# Patient Record
Sex: Male | Born: 1950 | Race: Black or African American | Hispanic: No | Smoking: Never smoker
Health system: Southern US, Community
[De-identification: ages and names within clinical notes are randomized; demographics above are authoritative.]

## PROBLEM LIST (undated history)

## (undated) DIAGNOSIS — I10 Essential (primary) hypertension: Secondary | ICD-10-CM

## (undated) DIAGNOSIS — K219 Gastro-esophageal reflux disease without esophagitis: Secondary | ICD-10-CM

## (undated) DIAGNOSIS — E119 Type 2 diabetes mellitus without complications: Secondary | ICD-10-CM

## (undated) DIAGNOSIS — M109 Gout, unspecified: Secondary | ICD-10-CM

## (undated) DIAGNOSIS — N189 Chronic kidney disease, unspecified: Secondary | ICD-10-CM

## (undated) DIAGNOSIS — I2510 Atherosclerotic heart disease of native coronary artery without angina pectoris: Secondary | ICD-10-CM

## (undated) DIAGNOSIS — E785 Hyperlipidemia, unspecified: Secondary | ICD-10-CM

## (undated) DIAGNOSIS — E039 Hypothyroidism, unspecified: Secondary | ICD-10-CM

## (undated) HISTORY — PX: BACK SURGERY: SHX140

## (undated) HISTORY — DX: Atherosclerotic heart disease of native coronary artery without angina pectoris: I25.10

## (undated) HISTORY — DX: Hyperlipidemia, unspecified: E78.5

## (undated) HISTORY — PX: CARDIAC CATHETERIZATION: SHX172

---

## 2016-12-16 DIAGNOSIS — M1990 Unspecified osteoarthritis, unspecified site: Secondary | ICD-10-CM | POA: Insufficient documentation

## 2016-12-16 DIAGNOSIS — I10 Essential (primary) hypertension: Secondary | ICD-10-CM | POA: Insufficient documentation

## 2016-12-16 DIAGNOSIS — E119 Type 2 diabetes mellitus without complications: Secondary | ICD-10-CM

## 2018-08-16 ENCOUNTER — Inpatient Hospital Stay (HOSPITAL_COMMUNITY)
Admission: AD | Admit: 2018-08-16 | Discharge: 2018-08-23 | DRG: 236 | Disposition: A | Discharge status: Home / Self Care | Payer: Medicare PPO | Source: Other Acute Inpatient Hospital | Attending: Thoracic Surgery (Cardiothoracic Vascular Surgery) | Admitting: Thoracic Surgery (Cardiothoracic Vascular Surgery)

## 2018-08-16 ENCOUNTER — Encounter (HOSPITAL_COMMUNITY): Payer: Self-pay | Admitting: Thoracic Surgery (Cardiothoracic Vascular Surgery)

## 2018-08-16 DIAGNOSIS — I129 Hypertensive chronic kidney disease with stage 1 through stage 4 chronic kidney disease, or unspecified chronic kidney disease: Secondary | ICD-10-CM | POA: Diagnosis present

## 2018-08-16 DIAGNOSIS — Z20828 Contact with and (suspected) exposure to other viral communicable diseases: Secondary | ICD-10-CM | POA: Diagnosis present

## 2018-08-16 DIAGNOSIS — Z951 Presence of aortocoronary bypass graft: Secondary | ICD-10-CM

## 2018-08-16 DIAGNOSIS — N183 Chronic kidney disease, stage 3 unspecified: Secondary | ICD-10-CM

## 2018-08-16 DIAGNOSIS — M109 Gout, unspecified: Secondary | ICD-10-CM | POA: Diagnosis present

## 2018-08-16 DIAGNOSIS — E039 Hypothyroidism, unspecified: Secondary | ICD-10-CM

## 2018-08-16 DIAGNOSIS — I252 Old myocardial infarction: Secondary | ICD-10-CM | POA: Diagnosis not present

## 2018-08-16 DIAGNOSIS — R079 Chest pain, unspecified: Secondary | ICD-10-CM

## 2018-08-16 DIAGNOSIS — I2510 Atherosclerotic heart disease of native coronary artery without angina pectoris: Secondary | ICD-10-CM | POA: Diagnosis present

## 2018-08-16 DIAGNOSIS — Z9889 Other specified postprocedural states: Secondary | ICD-10-CM

## 2018-08-16 DIAGNOSIS — I160 Hypertensive urgency: Secondary | ICD-10-CM | POA: Diagnosis present

## 2018-08-16 DIAGNOSIS — E1122 Type 2 diabetes mellitus with diabetic chronic kidney disease: Secondary | ICD-10-CM | POA: Diagnosis present

## 2018-08-16 DIAGNOSIS — K219 Gastro-esophageal reflux disease without esophagitis: Secondary | ICD-10-CM | POA: Diagnosis present

## 2018-08-16 DIAGNOSIS — I214 Non-ST elevation (NSTEMI) myocardial infarction: Principal | ICD-10-CM | POA: Diagnosis present

## 2018-08-16 DIAGNOSIS — R001 Bradycardia, unspecified: Secondary | ICD-10-CM | POA: Diagnosis present

## 2018-08-16 DIAGNOSIS — Z01810 Encounter for preprocedural cardiovascular examination: Secondary | ICD-10-CM | POA: Diagnosis not present

## 2018-08-16 DIAGNOSIS — I25110 Atherosclerotic heart disease of native coronary artery with unstable angina pectoris: Secondary | ICD-10-CM

## 2018-08-16 DIAGNOSIS — J90 Pleural effusion, not elsewhere classified: Secondary | ICD-10-CM

## 2018-08-16 DIAGNOSIS — E119 Type 2 diabetes mellitus without complications: Secondary | ICD-10-CM

## 2018-08-16 DIAGNOSIS — Z833 Family history of diabetes mellitus: Secondary | ICD-10-CM

## 2018-08-16 DIAGNOSIS — I440 Atrioventricular block, first degree: Secondary | ICD-10-CM

## 2018-08-16 DIAGNOSIS — Z01811 Encounter for preprocedural respiratory examination: Secondary | ICD-10-CM

## 2018-08-16 HISTORY — DX: Chronic kidney disease, unspecified: N18.9

## 2018-08-16 HISTORY — DX: Gastro-esophageal reflux disease without esophagitis: K21.9

## 2018-08-16 HISTORY — DX: Essential (primary) hypertension: I10

## 2018-08-16 HISTORY — DX: Hypothyroidism, unspecified: E03.9

## 2018-08-16 HISTORY — DX: Gout, unspecified: M10.9

## 2018-08-16 HISTORY — DX: Type 2 diabetes mellitus without complications: E11.9

## 2018-08-16 MED ORDER — HYDRALAZINE HCL 20 MG/ML IJ SOLN
10.0000 mg | Freq: Four times a day (QID) | INTRAMUSCULAR | Status: DC | PRN
  Administered 2018-08-17 – 2018-08-18 (×2): 10 mg via INTRAVENOUS
  Filled 2018-08-16 (×2): qty 1

## 2018-08-16 MED ORDER — INSULIN ASPART 100 UNIT/ML ~~LOC~~ SOLN
0.0000 [IU] | SUBCUTANEOUS | Status: DC
  Administered 2018-08-17 (×2): 4 [IU] via SUBCUTANEOUS
  Administered 2018-08-17: 8 [IU] via SUBCUTANEOUS
  Administered 2018-08-17 (×2): 4 [IU] via SUBCUTANEOUS
  Administered 2018-08-17: 12 [IU] via SUBCUTANEOUS
  Administered 2018-08-18: 4 [IU] via SUBCUTANEOUS

## 2018-08-16 MED ORDER — ASPIRIN EC 81 MG PO TBEC
81.0000 mg | DELAYED_RELEASE_TABLET | Freq: Every day | ORAL | Status: DC
  Administered 2018-08-17: 10:00:00 81 mg via ORAL
  Filled 2018-08-16: qty 1

## 2018-08-16 MED ORDER — ONDANSETRON HCL 4 MG/2ML IJ SOLN: 4.0000 mg | Freq: Four times a day (QID) | INTRAMUSCULAR | Status: DC | PRN

## 2018-08-16 MED ORDER — METOPROLOL TARTRATE 12.5 MG HALF TABLET
12.5000 mg | ORAL_TABLET | Freq: Two times a day (BID) | ORAL | Status: DC
  Administered 2018-08-17 (×3): 12.5 mg via ORAL
  Filled 2018-08-16 (×3): qty 1

## 2018-08-16 MED ORDER — NITROGLYCERIN 2 % TD OINT
1.0000 [in_us] | TOPICAL_OINTMENT | Freq: Four times a day (QID) | TRANSDERMAL | Status: DC | PRN
  Filled 2018-08-16: qty 30

## 2018-08-16 MED ORDER — ASPIRIN 81 MG PO CHEW
324.0000 mg | CHEWABLE_TABLET | ORAL | Status: AC
  Administered 2018-08-17: 01:00:00 324 mg via ORAL
  Filled 2018-08-16: qty 4

## 2018-08-16 MED ORDER — NITROGLYCERIN 0.4 MG SL SUBL: 0.4000 mg | SUBLINGUAL_TABLET | SUBLINGUAL | Status: DC | PRN

## 2018-08-16 MED ORDER — ASPIRIN 300 MG RE SUPP: 300.0000 mg | RECTAL | Status: AC

## 2018-08-16 MED ORDER — ATORVASTATIN CALCIUM 40 MG PO TABS
40.0000 mg | ORAL_TABLET | Freq: Every day | ORAL | Status: DC
  Administered 2018-08-17 – 2018-08-22 (×5): 40 mg via ORAL
  Filled 2018-08-16 (×5): qty 1

## 2018-08-16 MED ORDER — ACETAMINOPHEN 325 MG PO TABS: 650.0000 mg | ORAL_TABLET | ORAL | Status: DC | PRN

## 2018-08-16 MED ORDER — ENOXAPARIN SODIUM 40 MG/0.4ML ~~LOC~~ SOLN
40.0000 mg | SUBCUTANEOUS | Status: DC
  Administered 2018-08-17 (×2): 40 mg via SUBCUTANEOUS
  Filled 2018-08-16 (×2): qty 0.4

## 2018-08-16 NOTE — Progress Notes (Signed)
Patient arrived to [facility] with EMS, A/O X4, in NAD. Patient denied any CP, SOB. VS stable. Attending MD paged.

## 2018-08-16 NOTE — H&P (Addendum)
[address]       [place] [id number]             [phone]        Frederich ChickFrank Bordeaux [place] Medical Record #[id number] Date of Birth: 10/13/1950  Referring: No ref. provider found Primary Care: Chana BodeFatade, Ayokunle, DO Primary Cardiologist:No primary care provider on file.  Chief Complaint:   No chief complaint on file.   History of Present Illness:     68 yo male transferred [facility] with CAD, CKD, and symptomatic bradycardia.  He originally presented with nausea and vomiting, and was noted to be in HTN urgency, and bradycardic.  Cardiac evaluation revealed the CAD.  [facility] was [facility].   Past Medical and Surgical History: Previous Chest Surgery: no Previous Chest Radiation: no Diabetes Mellitus: yes.  HbA1C pending Creatinine: 1.7  Past Medical History:  Diagnosis Date  . CKD (chronic kidney disease)   . DM (diabetes mellitus) (HCC)   . GERD (gastroesophageal reflux disease)   . Gout   . HTN (hypertension)   . Hypothyroidism     Past Surgical History:  Procedure Laterality Date  . BACK SURGERY        Social History   Tobacco Use  Smoking Status Never Smoker    Social History   Substance and Sexual Activity  Alcohol Use Not Currently     Not on File  Medications: Asprin: yes Statin: yes Beta Blocker: yes Ace Inhibitor: no Anti-Coagulation: no  Current Facility-Administered Medications  Medication Dose Route Frequency Provider Last Rate Last Dose  . acetaminophen (TYLENOL) tablet 650 mg  650 mg Oral Q4H PRN Darvis Croft O, MD      . aspirin chewable tablet 324 mg  324 mg Oral NOW Drea Jurewicz O, MD       Or  . aspirin suppository 300 mg  300 mg Rectal NOW Corliss SkainsLightfoot, Elise Knobloch O, MD      . Melene Muller[START ON 08/17/2018] aspirin EC tablet 81 mg  81 mg Oral Daily Corliss SkainsLightfoot, Gurinder Toral O, MD      . Melene Muller[START ON 08/17/2018] atorvastatin (LIPITOR) tablet 40 mg  40 mg Oral q1800 Rik Wadel,  Lesley Galentine O, MD      . enoxaparin (LOVENOX) injection 40 mg  40 mg Subcutaneous Q24H Avalene Sealy O, MD      . hydrALAZINE (APRESOLINE) injection 10 mg  10 mg Intravenous Q6H PRN Corliss SkainsLightfoot, Sosie Gato O, MD      . Melene Muller[START ON 08/17/2018] insulin aspart (novoLOG) injection 0-24 Units  0-24 Units Subcutaneous Q4H Lorita Forinash O, MD      . metoprolol tartrate (LOPRESSOR) tablet 12.5 mg  12.5 mg Oral BID Ferrell Flam O, MD      . nitroGLYCERIN (NITROGLYN) 2 % ointment 1 inch  1 inch Topical Q6H PRN Rayola Everhart O, MD      . nitroGLYCERIN (NITROSTAT) SL tablet 0.4 mg  0.4 mg Sublingual Q5 Min x 3 PRN Wynell Halberg O, MD      . ondansetron (ZOFRAN) injection 4 mg  4 mg Intravenous Q6H PRN Teletha Petrea, Eliezer LoftsHarrell O, MD        No medications prior to admission.    Family History  Problem Relation Age of Onset  . Aneurysm Mother   . Diabetes Sister   . Pancreatic cancer Brother   . Diabetes Brother      Review of Systems:   Review of Systems  Constitutional: Negative.  HENT: Negative.   Respiratory: Negative.   Cardiovascular: Negative.   Gastrointestinal: Positive for nausea and vomiting.  Musculoskeletal: Positive for joint pain.  Neurological: Negative.       Physical Exam: BP (!) 155/88 (BP Location: Left Leg)   Pulse 82   Temp 98.1 F (36.7 C) (Oral)   Resp 18   Ht 5\' 8"  (1.727 m)   Wt 78.2 kg   SpO2 100%   BMI 26.21 kg/m  General:  Appears  stated age.  nontoxic HEENT: NCAT, MMM, OP clear.  good dentition Neuro: Nonfocal, alert, oriented.   Psych: appropriate affect.  Cardiovascular: RRR, sinus, no murmur, nocarotid bruit, good peripheral pulses  Pulmonary: EWOB, clear breath sounds GI: NT/ND Extremities: noe peripheral edema     Diagnostic Studies & Laboratory data:    Left Heart Catherization: Report reviewed.  Loading images.  LAD, diag and circ disease Echo: Moderately reduced LV function, good RV function, no valvular disease   I  have independently reviewed the above radiologic studies and discussed with the patient   Recent Lab Findings: No results found for: WBC, HGB, HCT, PLT, GLUCOSE, CHOL, TRIG, HDL, LDLDIRECT, LDLCALC, ALT, AST, NA, K, CL, CREATININE, BUN, CO2, TSH, INR, GLUF, HGBA1C    Assessment / Plan:   68 yo male presents with NSTEMI, and symptomatic bradycardia.  LHC revealed LAD and circ disease.  Also has a hx of CRI.    Will plan for CABG later this week.     I  spent 30 minutes counseling the patient face to face.   Lajuana Matte 08/16/2018 11:28 PM

## 2018-08-17 ENCOUNTER — Inpatient Hospital Stay (HOSPITAL_COMMUNITY): Payer: Medicare PPO

## 2018-08-17 DIAGNOSIS — Z01810 Encounter for preprocedural cardiovascular examination: Secondary | ICD-10-CM

## 2018-08-17 LAB — DIFFERENTIAL
Basophils Absolute: 0 10*3/uL (ref 0.0–0.1)
Basophils Relative: 0 %
Eosinophils Absolute: 0.1 10*3/uL (ref 0.0–0.5)
Eosinophils Relative: 2 %
Lymphocytes Relative: 36 %
Lymphs Abs: 2.2 10*3/uL (ref 0.7–4.0)
Monocytes Absolute: 0.4 10*3/uL (ref 0.1–1.0)
Monocytes Relative: 7 %
Neutro Abs: 3.4 10*3/uL (ref 1.7–7.7)
Neutrophils Relative %: 55 %

## 2018-08-17 LAB — CBC
HCT: 33.8 % — ABNORMAL LOW (ref 39.0–52.0)
Hemoglobin: 10.8 g/dL — ABNORMAL LOW (ref 13.0–17.0)
MCH: 27.1 pg (ref 26.0–34.0)
MCHC: 32 g/dL (ref 30.0–36.0)
MCV: 84.9 fL (ref 80.0–100.0)
Platelets: 289 10*3/uL (ref 150–400)
RBC: 3.98 MIL/uL — ABNORMAL LOW (ref 4.22–5.81)
RDW: 15.3 % (ref 11.5–15.5)
WBC: 6.3 10*3/uL (ref 4.0–10.5)
nRBC: 0 % (ref 0.0–0.2)

## 2018-08-17 LAB — COMPREHENSIVE METABOLIC PANEL
ALT: 29 U/L (ref 0–44)
AST: 28 U/L (ref 15–41)
Albumin: 3.2 g/dL — ABNORMAL LOW (ref 3.5–5.0)
Alkaline Phosphatase: 45 U/L (ref 38–126)
Anion gap: 9 (ref 5–15)
BUN: 21 mg/dL (ref 8–23)
CO2: 20 mmol/L — ABNORMAL LOW (ref 22–32)
Calcium: 8.8 mg/dL — ABNORMAL LOW (ref 8.9–10.3)
Chloride: 107 mmol/L (ref 98–111)
Creatinine, Ser: 1.73 mg/dL — ABNORMAL HIGH (ref 0.61–1.24)
GFR calc Af Amer: 47 mL/min — ABNORMAL LOW (ref >60)
GFR calc non Af Amer: 40 mL/min — ABNORMAL LOW (ref >60)
Glucose, Bld: 278 mg/dL — ABNORMAL HIGH (ref 70–99)
Potassium: 4.3 mmol/L (ref 3.5–5.1)
Sodium: 136 mmol/L (ref 135–145)
Total Bilirubin: 0.5 mg/dL (ref 0.3–1.2)
Total Protein: 5.9 g/dL — ABNORMAL LOW (ref 6.5–8.1)

## 2018-08-17 LAB — GLUCOSE, CAPILLARY
Glucose-Capillary: 160 mg/dL — ABNORMAL HIGH (ref 70–99)
Glucose-Capillary: 164 mg/dL — ABNORMAL HIGH (ref 70–99)
Glucose-Capillary: 171 mg/dL — ABNORMAL HIGH (ref 70–99)
Glucose-Capillary: 187 mg/dL — ABNORMAL HIGH (ref 70–99)
Glucose-Capillary: 208 mg/dL — ABNORMAL HIGH (ref 70–99)
Glucose-Capillary: 251 mg/dL — ABNORMAL HIGH (ref 70–99)

## 2018-08-17 LAB — PROTIME-INR
INR: 1 (ref 0.8–1.2)
Prothrombin Time: 13.4 seconds (ref 11.4–15.2)

## 2018-08-17 LAB — APTT: aPTT: 28 seconds (ref 24–36)

## 2018-08-17 LAB — ABO/RH: ABO/RH(D): B POS

## 2018-08-17 LAB — SURGICAL PCR SCREEN
MRSA, PCR: NEGATIVE
Staphylococcus aureus: NEGATIVE

## 2018-08-17 LAB — HEMOGLOBIN A1C
Hgb A1c MFr Bld: 9.5 % — ABNORMAL HIGH (ref 4.8–5.6)
Mean Plasma Glucose: 225.95 mg/dL

## 2018-08-17 LAB — HIV ANTIBODY (ROUTINE TESTING W REFLEX): HIV Screen 4th Generation wRfx: NONREACTIVE

## 2018-08-17 LAB — SARS CORONAVIRUS 2 (TAT 6-24 HRS): SARS Coronavirus 2: NEGATIVE

## 2018-08-17 LAB — PREPARE RBC (CROSSMATCH)

## 2018-08-17 MED ORDER — EPINEPHRINE HCL 5 MG/250ML IV SOLN IN NS
0.0000 ug/min | INTRAVENOUS | Status: DC
  Filled 2018-08-17: qty 250

## 2018-08-17 MED ORDER — MILRINONE LACTATE IN DEXTROSE 20-5 MG/100ML-% IV SOLN
0.3000 ug/kg/min | INTRAVENOUS | Status: DC
  Filled 2018-08-17: qty 100

## 2018-08-17 MED ORDER — TRANEXAMIC ACID (OHS) PUMP PRIME SOLUTION
2.0000 mg/kg | INTRAVENOUS | Status: DC
  Filled 2018-08-17: qty 1.56

## 2018-08-17 MED ORDER — CHLORHEXIDINE GLUCONATE 0.12 % MT SOLN
15.0000 mL | Freq: Once | OROMUCOSAL | Status: AC
  Administered 2018-08-18: 05:00:00 15 mL via OROMUCOSAL
  Filled 2018-08-17: qty 15

## 2018-08-17 MED ORDER — METOPROLOL TARTRATE 12.5 MG HALF TABLET
12.5000 mg | ORAL_TABLET | Freq: Once | ORAL | Status: AC
  Administered 2018-08-18: 05:00:00 12.5 mg via ORAL
  Filled 2018-08-17: qty 1

## 2018-08-17 MED ORDER — TRANEXAMIC ACID (OHS) BOLUS VIA INFUSION
15.0000 mg/kg | INTRAVENOUS | Status: AC
  Administered 2018-08-18: 1173 mg via INTRAVENOUS
  Filled 2018-08-17: qty 1173

## 2018-08-17 MED ORDER — PHENYLEPHRINE HCL-NACL 20-0.9 MG/250ML-% IV SOLN
30.0000 ug/min | INTRAVENOUS | Status: AC
  Administered 2018-08-18: 15 ug/min via INTRAVENOUS
  Filled 2018-08-17: qty 250

## 2018-08-17 MED ORDER — INSULIN REGULAR(HUMAN) IN NACL 100-0.9 UT/100ML-% IV SOLN
INTRAVENOUS | Status: AC
  Administered 2018-08-18: 09:00:00 2 [IU]/h via INTRAVENOUS
  Filled 2018-08-17: qty 100

## 2018-08-17 MED ORDER — MANNITOL 20 % IV SOLN
INTRAVENOUS | Status: DC
  Filled 2018-08-17: qty 13

## 2018-08-17 MED ORDER — CHLORHEXIDINE GLUCONATE 4 % EX LIQD
CUTANEOUS | Status: AC
  Administered 2018-08-17: 22:00:00
  Filled 2018-08-17: qty 15

## 2018-08-17 MED ORDER — SODIUM CHLORIDE 0.9 % IV SOLN
INTRAVENOUS | Status: DC
  Filled 2018-08-17: qty 30

## 2018-08-17 MED ORDER — SODIUM CHLORIDE 0.9 % IV SOLN
750.0000 mg | INTRAVENOUS | Status: AC
  Administered 2018-08-18: 750 mg via INTRAVENOUS
  Filled 2018-08-17: qty 750

## 2018-08-17 MED ORDER — SODIUM CHLORIDE 0.9 % IV SOLN
1.5000 g | INTRAVENOUS | Status: AC
  Administered 2018-08-18: 09:00:00 1.5 g via INTRAVENOUS
  Filled 2018-08-17: qty 1.5

## 2018-08-17 MED ORDER — POTASSIUM CHLORIDE 2 MEQ/ML IV SOLN
80.0000 meq | INTRAVENOUS | Status: DC
  Filled 2018-08-17: qty 40

## 2018-08-17 MED ORDER — NITROGLYCERIN IN D5W 200-5 MCG/ML-% IV SOLN
2.0000 ug/min | INTRAVENOUS | Status: AC
  Administered 2018-08-18: 10:00:00 10 ug/min via INTRAVENOUS
  Filled 2018-08-17: qty 250

## 2018-08-17 MED ORDER — PLASMA-LYTE 148 IV SOLN
INTRAVENOUS | Status: DC
  Filled 2018-08-17: qty 2.5

## 2018-08-17 MED ORDER — CHLORHEXIDINE GLUCONATE CLOTH 2 % EX PADS
6.0000 | MEDICATED_PAD | Freq: Once | CUTANEOUS | Status: AC
  Administered 2018-08-17: 22:00:00 6 via TOPICAL

## 2018-08-17 MED ORDER — VANCOMYCIN HCL 10 G IV SOLR
1250.0000 mg | INTRAVENOUS | Status: AC
  Administered 2018-08-18: 1250 mg via INTRAVENOUS
  Filled 2018-08-17: qty 1250

## 2018-08-17 MED ORDER — DOPAMINE-DEXTROSE 3.2-5 MG/ML-% IV SOLN
0.0000 ug/kg/min | INTRAVENOUS | Status: DC
  Filled 2018-08-17: qty 250

## 2018-08-17 MED ORDER — TEMAZEPAM 15 MG PO CAPS: 15.0000 mg | ORAL_CAPSULE | Freq: Once | ORAL | Status: DC | PRN

## 2018-08-17 MED ORDER — TRANEXAMIC ACID 1000 MG/10ML IV SOLN
1.5000 mg/kg/h | INTRAVENOUS | Status: AC
  Administered 2018-08-18: 1.5 mg/kg/h via INTRAVENOUS
  Filled 2018-08-17: qty 25

## 2018-08-17 MED ORDER — BISACODYL 5 MG PO TBEC
5.0000 mg | DELAYED_RELEASE_TABLET | Freq: Once | ORAL | Status: AC
  Administered 2018-08-17: 5 mg via ORAL
  Filled 2018-08-17: qty 1

## 2018-08-17 MED ORDER — DEXMEDETOMIDINE HCL IN NACL 400 MCG/100ML IV SOLN
0.1000 ug/kg/h | INTRAVENOUS | Status: AC
  Administered 2018-08-18: 11:00:00 .5 ug/kg/h via INTRAVENOUS
  Filled 2018-08-17: qty 100

## 2018-08-17 MED ORDER — CHLORHEXIDINE GLUCONATE CLOTH 2 % EX PADS: 6.0000 | MEDICATED_PAD | Freq: Once | CUTANEOUS | Status: DC

## 2018-08-17 NOTE — Progress Notes (Signed)
Pre-CABG Doppler completed. Refer to "CV Proc" under chart review to view preliminary results.  08/17/2018 7:16 PM Maudry Mayhew, MHA, RVT, RDCS, RDMS

## 2018-08-17 NOTE — Plan of Care (Signed)

## 2018-08-17 NOTE — Progress Notes (Signed)
Discussed sternal precautions, IS (1200 mL), mobility, and d/c planning. Voiced understanding and gave materials to review. Good reception. He sts he will stay with his son or a friend at d/c (normally lives alone).  [place], ACSM 11:42 AM 08/17/2018

## 2018-08-17 NOTE — Anesthesia Preprocedure Evaluation (Addendum)
Anesthesia Evaluation  Patient identified by MRN, date of birth, ID band Patient awake    Reviewed: Allergy & Precautions, NPO status   Airway Mallampati: II  TM Distance: >3 FB     Dental   Pulmonary    breath sounds clear to auscultation       Cardiovascular hypertension, + Past MI  (-) dysrhythmias  Rhythm:Regular Rate:Normal     Neuro/Psych    GI/Hepatic GERD  ,  Endo/Other  diabetesHypothyroidism   Renal/GU Renal disease     Musculoskeletal   Abdominal   Peds  Hematology   Anesthesia Other Findings   Reproductive/Obstetrics                           Anesthesia Physical Anesthesia Plan  ASA: III  Anesthesia Plan: General   Post-op Pain Management:    Induction: Intravenous  PONV Risk Score and Plan: 2 and Midazolam  Airway Management Planned: Oral ETT  Additional Equipment: Arterial line, PA Cath, TEE and Ultrasound Guidance Line Placement  Intra-op Plan:   Post-operative Plan: Post-operative intubation/ventilation  Informed Consent: I have reviewed the patients History and Physical, chart, labs and discussed the procedure including the risks, benefits and alternatives for the proposed anesthesia with the patient or authorized representative who has indicated his/her understanding and acceptance.     Dental advisory given  Plan Discussed with: Anesthesiologist, CRNA and Surgeon  Anesthesia Plan Comments:        Anesthesia Quick Evaluation

## 2018-08-17 NOTE — Progress Notes (Addendum)
      [address]       [place] [address]             [phone]            Subjective: Patient denies chest pain or shortness of breath  Objective: Vital signs in last 24 hours: Temp:  [98.1 F (36.7 C)-98.5 F (36.9 C)] 98.2 F (36.8 C) (08/12 0742) Pulse Rate:  [82-91] 91 (08/12 0742) Cardiac Rhythm: Heart block (08/12 0019) Resp:  [18-21] 18 (08/12 0742) BP: (128-155)/(78-91) 128/91 (08/12 0742) SpO2:  [97 %-100 %] 99 % (08/12 0742) Weight:  [78.2 kg] 78.2 kg (08/11 2155)   Current Weight  08/16/18 78.2 kg       Intake/Output from previous day: No intake/output data recorded.   Physical Exam:  Cardiovascular: RRR Pulmonary: Clear to auscultation bilaterally Abdomen: Soft, non tender, bowel sounds present. Extremities: No lower extremity edema. Neurologic: Grossly intact without focal deficits  Lab Results: CBC: Recent Labs    08/17/18 0016  WBC 6.3  HGB 10.8*  HCT 33.8*  PLT 289   BMET:  Recent Labs    08/17/18 0015  NA 136  K 4.3  CL 107  CO2 20*  GLUCOSE 278*  BUN 21  CREATININE 1.73*  CALCIUM 8.8*    PT/INR:  Lab Results  Component Value Date   INR 1.0 08/17/2018   ABG:  INR: Will add last result for INR, ABG once components are confirmed Will add last 4 CBG results once components are confirmed  Assessment/Plan:  1. CV - S/p NSTEMI. First degree heart block. Has history of hypertension. On Lopressor 12.5 mg bid. Will need CABG;likely in am 2.  Pulmonary - On room air. 3.  Acute blood loss anemia - H and H 10.8 and 33.8 4. DM-CBGs 251/17/ . On Insulin. HGA1C 9.5 5. 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   Donielle M ZimmermanPA-C 08/17/2018,7:56 AM [phone]  Agree with above Images reviewed.  Descent LAD, and diag target.  Severely disease Circ distribution, with poor run-off.  Will plan for CABG 2-3 tomorrow.

## 2018-08-17 NOTE — Progress Notes (Signed)
Per verbal order.. Dr.Lightfoot authorized for patient to take shower.

## 2018-08-18 ENCOUNTER — Inpatient Hospital Stay (HOSPITAL_COMMUNITY): Payer: Medicare PPO | Admitting: Registered Nurse

## 2018-08-18 ENCOUNTER — Inpatient Hospital Stay (HOSPITAL_COMMUNITY)
Admission: AD | Disposition: A | Discharge status: Home / Self Care | Payer: Self-pay | Source: Other Acute Inpatient Hospital | Attending: Thoracic Surgery (Cardiothoracic Vascular Surgery)

## 2018-08-18 ENCOUNTER — Inpatient Hospital Stay (HOSPITAL_COMMUNITY): Payer: Medicare PPO

## 2018-08-18 ENCOUNTER — Other Ambulatory Visit: Payer: Self-pay

## 2018-08-18 ENCOUNTER — Encounter (HOSPITAL_COMMUNITY): Payer: Self-pay

## 2018-08-18 DIAGNOSIS — Z951 Presence of aortocoronary bypass graft: Secondary | ICD-10-CM

## 2018-08-18 DIAGNOSIS — I25110 Atherosclerotic heart disease of native coronary artery with unstable angina pectoris: Secondary | ICD-10-CM

## 2018-08-18 DIAGNOSIS — I2510 Atherosclerotic heart disease of native coronary artery without angina pectoris: Secondary | ICD-10-CM | POA: Diagnosis present

## 2018-08-18 DIAGNOSIS — I214 Non-ST elevation (NSTEMI) myocardial infarction: Secondary | ICD-10-CM

## 2018-08-18 HISTORY — PX: TEE WITHOUT CARDIOVERSION: SHX5443

## 2018-08-18 HISTORY — PX: CORONARY ARTERY BYPASS GRAFT: SHX141

## 2018-08-18 LAB — CBC WITH DIFFERENTIAL/PLATELET
Abs Immature Granulocytes: 0.04 10*3/uL (ref 0.00–0.07)
Basophils Absolute: 0 10*3/uL (ref 0.0–0.1)
Basophils Relative: 0 %
Eosinophils Absolute: 0 10*3/uL (ref 0.0–0.5)
Eosinophils Relative: 0 %
HCT: 29.3 % — ABNORMAL LOW (ref 39.0–52.0)
Hemoglobin: 9.1 g/dL — ABNORMAL LOW (ref 13.0–17.0)
Immature Granulocytes: 1 %
Lymphocytes Relative: 6 %
Lymphs Abs: 0.5 10*3/uL — ABNORMAL LOW (ref 0.7–4.0)
MCH: 27 pg (ref 26.0–34.0)
MCHC: 31.1 g/dL (ref 30.0–36.0)
MCV: 86.9 fL (ref 80.0–100.0)
Monocytes Absolute: 0.4 10*3/uL (ref 0.1–1.0)
Monocytes Relative: 5 %
Neutro Abs: 7.8 10*3/uL — ABNORMAL HIGH (ref 1.7–7.7)
Neutrophils Relative %: 88 %
Platelets: 229 10*3/uL (ref 150–400)
RBC: 3.37 MIL/uL — ABNORMAL LOW (ref 4.22–5.81)
RDW: 15.1 % (ref 11.5–15.5)
WBC: 8.8 10*3/uL (ref 4.0–10.5)
nRBC: 0 % (ref 0.0–0.2)

## 2018-08-18 LAB — POCT I-STAT 7, (LYTES, BLD GAS, ICA,H+H)
Acid-base deficit: 1 mmol/L (ref 0.0–2.0)
Acid-base deficit: 5 mmol/L — ABNORMAL HIGH (ref 0.0–2.0)
Acid-base deficit: 7 mmol/L — ABNORMAL HIGH (ref 0.0–2.0)
Acid-base deficit: 5 mmol/L — ABNORMAL HIGH (ref 0.0–2.0)
Bicarbonate: 23.4 mmol/L (ref 20.0–28.0)
Bicarbonate: 19.3 mmol/L — ABNORMAL LOW (ref 20.0–28.0)
Bicarbonate: 18.5 mmol/L — ABNORMAL LOW (ref 20.0–28.0)
Bicarbonate: 20.1 mmol/L (ref 20.0–28.0)
Calcium, Ion: 1.09 mmol/L — ABNORMAL LOW (ref 1.15–1.40)
Calcium, Ion: 1.28 mmol/L (ref 1.15–1.40)
Calcium, Ion: 1.28 mmol/L (ref 1.15–1.40)
Calcium, Ion: 1.24 mmol/L (ref 1.15–1.40)
HCT: 22 % — ABNORMAL LOW (ref 39.0–52.0)
HCT: 22 % — ABNORMAL LOW (ref 39.0–52.0)
HCT: 26 % — ABNORMAL LOW (ref 39.0–52.0)
HCT: 27 % — ABNORMAL LOW (ref 39.0–52.0)
Hemoglobin: 7.5 g/dL — ABNORMAL LOW (ref 13.0–17.0)
Hemoglobin: 7.5 g/dL — ABNORMAL LOW (ref 13.0–17.0)
Hemoglobin: 8.8 g/dL — ABNORMAL LOW (ref 13.0–17.0)
Hemoglobin: 9.2 g/dL — ABNORMAL LOW (ref 13.0–17.0)
O2 Saturation: 100 %
O2 Saturation: 99 %
O2 Saturation: 98 %
O2 Saturation: 99 %
Patient temperature: 35.5
Patient temperature: 35.7
Patient temperature: 36.5
Potassium: 3.4 mmol/L — ABNORMAL LOW (ref 3.5–5.1)
Potassium: 4 mmol/L (ref 3.5–5.1)
Potassium: 4 mmol/L (ref 3.5–5.1)
Potassium: 3.9 mmol/L (ref 3.5–5.1)
Sodium: 139 mmol/L (ref 135–145)
Sodium: 140 mmol/L (ref 135–145)
Sodium: 138 mmol/L (ref 135–145)
Sodium: 139 mmol/L (ref 135–145)
TCO2: 25 mmol/L (ref 22–32)
TCO2: 20 mmol/L — ABNORMAL LOW (ref 22–32)
TCO2: 20 mmol/L — ABNORMAL LOW (ref 22–32)
TCO2: 21 mmol/L — ABNORMAL LOW (ref 22–32)
pCO2 arterial: 37.2 mmHg (ref 32.0–48.0)
pCO2 arterial: 28.7 mmHg — ABNORMAL LOW (ref 32.0–48.0)
pCO2 arterial: 34.5 mmHg (ref 32.0–48.0)
pCO2 arterial: 35.5 mmHg (ref 32.0–48.0)
pH, Arterial: 7.407 (ref 7.350–7.450)
pH, Arterial: 7.43 (ref 7.350–7.450)
pH, Arterial: 7.331 — ABNORMAL LOW (ref 7.350–7.450)
pH, Arterial: 7.36 (ref 7.350–7.450)
pO2, Arterial: 342 mmHg — ABNORMAL HIGH (ref 83.0–108.0)
pO2, Arterial: 146 mmHg — ABNORMAL HIGH (ref 83.0–108.0)
pO2, Arterial: 110 mmHg — ABNORMAL HIGH (ref 83.0–108.0)
pO2, Arterial: 128 mmHg — ABNORMAL HIGH (ref 83.0–108.0)

## 2018-08-18 LAB — PROTIME-INR
INR: 1.3 — ABNORMAL HIGH (ref 0.8–1.2)
Prothrombin Time: 16.1 seconds — ABNORMAL HIGH (ref 11.4–15.2)

## 2018-08-18 LAB — POCT I-STAT, CHEM 8
BUN: 14 mg/dL (ref 8–23)
Calcium, Ion: 1.21 mmol/L (ref 1.15–1.40)
Chloride: 123 mmol/L — ABNORMAL HIGH (ref 98–111)
Creatinine, Ser: 1.2 mg/dL (ref 0.61–1.24)
Glucose, Bld: 185 mg/dL — ABNORMAL HIGH (ref 70–99)
HCT: 31 % — ABNORMAL LOW (ref 39.0–52.0)
Hemoglobin: 10.5 g/dL — ABNORMAL LOW (ref 13.0–17.0)
Potassium: 4 mmol/L (ref 3.5–5.1)
Sodium: 138 mmol/L (ref 135–145)
TCO2: 19 mmol/L — ABNORMAL LOW (ref 22–32)

## 2018-08-18 LAB — BASIC METABOLIC PANEL
Anion gap: 12 (ref 5–15)
Anion gap: 12 (ref 5–15)
BUN: 17 mg/dL (ref 8–23)
BUN: 13 mg/dL (ref 8–23)
CO2: 21 mmol/L — ABNORMAL LOW (ref 22–32)
CO2: 19 mmol/L — ABNORMAL LOW (ref 22–32)
Calcium: 9.3 mg/dL (ref 8.9–10.3)
Calcium: 8.8 mg/dL — ABNORMAL LOW (ref 8.9–10.3)
Chloride: 103 mmol/L (ref 98–111)
Chloride: 104 mmol/L (ref 98–111)
Creatinine, Ser: 1.61 mg/dL — ABNORMAL HIGH (ref 0.61–1.24)
Creatinine, Ser: 1.39 mg/dL — ABNORMAL HIGH (ref 0.61–1.24)
GFR calc Af Amer: 51 mL/min — ABNORMAL LOW (ref >60)
GFR calc Af Amer: >60 mL/min (ref >60)
GFR calc non Af Amer: 44 mL/min — ABNORMAL LOW (ref >60)
GFR calc non Af Amer: 52 mL/min — ABNORMAL LOW (ref >60)
Glucose, Bld: 186 mg/dL — ABNORMAL HIGH (ref 70–99)
Glucose, Bld: 191 mg/dL — ABNORMAL HIGH (ref 70–99)
Potassium: 3.9 mmol/L (ref 3.5–5.1)
Potassium: 3.9 mmol/L (ref 3.5–5.1)
Sodium: 136 mmol/L (ref 135–145)
Sodium: 135 mmol/L (ref 135–145)

## 2018-08-18 LAB — CBC
HCT: 36 % — ABNORMAL LOW (ref 39.0–52.0)
HCT: 26.1 % — ABNORMAL LOW (ref 39.0–52.0)
Hemoglobin: 11.4 g/dL — ABNORMAL LOW (ref 13.0–17.0)
Hemoglobin: 8.2 g/dL — ABNORMAL LOW (ref 13.0–17.0)
MCH: 27.1 pg (ref 26.0–34.0)
MCH: 27.4 pg (ref 26.0–34.0)
MCHC: 31.7 g/dL (ref 30.0–36.0)
MCHC: 31.4 g/dL (ref 30.0–36.0)
MCV: 85.5 fL (ref 80.0–100.0)
MCV: 87.3 fL (ref 80.0–100.0)
Platelets: 318 10*3/uL (ref 150–400)
Platelets: 175 10*3/uL (ref 150–400)
RBC: 4.21 MIL/uL — ABNORMAL LOW (ref 4.22–5.81)
RBC: 2.99 MIL/uL — ABNORMAL LOW (ref 4.22–5.81)
RDW: 15.3 % (ref 11.5–15.5)
RDW: 15.2 % (ref 11.5–15.5)
WBC: 5.8 10*3/uL (ref 4.0–10.5)
WBC: 6.9 10*3/uL (ref 4.0–10.5)
nRBC: 0 % (ref 0.0–0.2)
nRBC: 0 % (ref 0.0–0.2)

## 2018-08-18 LAB — POCT I-STAT 4, (NA,K, GLUC, HGB,HCT)
Glucose, Bld: 146 mg/dL — ABNORMAL HIGH (ref 70–99)
Glucose, Bld: 124 mg/dL — ABNORMAL HIGH (ref 70–99)
Glucose, Bld: 126 mg/dL — ABNORMAL HIGH (ref 70–99)
Glucose, Bld: 119 mg/dL — ABNORMAL HIGH (ref 70–99)
Glucose, Bld: 112 mg/dL — ABNORMAL HIGH (ref 70–99)
HCT: 34 % — ABNORMAL LOW (ref 39.0–52.0)
HCT: 27 % — ABNORMAL LOW (ref 39.0–52.0)
HCT: 21 % — ABNORMAL LOW (ref 39.0–52.0)
HCT: 23 % — ABNORMAL LOW (ref 39.0–52.0)
HCT: 22 % — ABNORMAL LOW (ref 39.0–52.0)
Hemoglobin: 11.6 g/dL — ABNORMAL LOW (ref 13.0–17.0)
Hemoglobin: 9.2 g/dL — ABNORMAL LOW (ref 13.0–17.0)
Hemoglobin: 7.1 g/dL — ABNORMAL LOW (ref 13.0–17.0)
Hemoglobin: 7.8 g/dL — ABNORMAL LOW (ref 13.0–17.0)
Hemoglobin: 7.5 g/dL — ABNORMAL LOW (ref 13.0–17.0)
Potassium: 3.8 mmol/L (ref 3.5–5.1)
Potassium: 3.7 mmol/L (ref 3.5–5.1)
Potassium: 4.2 mmol/L (ref 3.5–5.1)
Potassium: 4 mmol/L (ref 3.5–5.1)
Potassium: 4 mmol/L (ref 3.5–5.1)
Sodium: 138 mmol/L (ref 135–145)
Sodium: 138 mmol/L (ref 135–145)
Sodium: 137 mmol/L (ref 135–145)
Sodium: 137 mmol/L (ref 135–145)
Sodium: 138 mmol/L (ref 135–145)

## 2018-08-18 LAB — GLUCOSE, CAPILLARY
Glucose-Capillary: 111 mg/dL — ABNORMAL HIGH (ref 70–99)
Glucose-Capillary: 115 mg/dL — ABNORMAL HIGH (ref 70–99)
Glucose-Capillary: 118 mg/dL — ABNORMAL HIGH (ref 70–99)
Glucose-Capillary: 132 mg/dL — ABNORMAL HIGH (ref 70–99)
Glucose-Capillary: 161 mg/dL — ABNORMAL HIGH (ref 70–99)
Glucose-Capillary: 166 mg/dL — ABNORMAL HIGH (ref 70–99)
Glucose-Capillary: 170 mg/dL — ABNORMAL HIGH (ref 70–99)
Glucose-Capillary: 177 mg/dL — ABNORMAL HIGH (ref 70–99)
Glucose-Capillary: 180 mg/dL — ABNORMAL HIGH (ref 70–99)
Glucose-Capillary: 181 mg/dL — ABNORMAL HIGH (ref 70–99)
Glucose-Capillary: 183 mg/dL — ABNORMAL HIGH (ref 70–99)
Glucose-Capillary: 191 mg/dL — ABNORMAL HIGH (ref 70–99)
Glucose-Capillary: 192 mg/dL — ABNORMAL HIGH (ref 70–99)

## 2018-08-18 LAB — MAGNESIUM: Magnesium: 2.8 mg/dL — ABNORMAL HIGH (ref 1.7–2.4)

## 2018-08-18 LAB — APTT: aPTT: 35 seconds (ref 24–36)

## 2018-08-18 LAB — HEMOGLOBIN AND HEMATOCRIT, BLOOD
HCT: 23.1 % — ABNORMAL LOW (ref 39.0–52.0)
Hemoglobin: 7.2 g/dL — ABNORMAL LOW (ref 13.0–17.0)

## 2018-08-18 LAB — PLATELET COUNT: Platelets: 204 10*3/uL (ref 150–400)

## 2018-08-18 SURGERY — CORONARY ARTERY BYPASS GRAFTING (CABG)
Anesthesia: General | Site: Chest

## 2018-08-18 MED ORDER — NITROGLYCERIN 0.2 MG/ML ON CALL CATH LAB
INTRAVENOUS | Status: DC | PRN
  Administered 2018-08-18 (×2): 20 ug via INTRAVENOUS

## 2018-08-18 MED ORDER — BISACODYL 5 MG PO TBEC
10.0000 mg | DELAYED_RELEASE_TABLET | Freq: Every day | ORAL | Status: DC
  Administered 2018-08-19 – 2018-08-22 (×2): 10 mg via ORAL
  Filled 2018-08-18 (×3): qty 2

## 2018-08-18 MED ORDER — BISACODYL 10 MG RE SUPP: 10.0000 mg | Freq: Every day | RECTAL | Status: DC

## 2018-08-18 MED ORDER — ROCURONIUM BROMIDE 10 MG/ML (PF) SYRINGE
PREFILLED_SYRINGE | INTRAVENOUS | Status: DC | PRN
  Administered 2018-08-18: 50 mg via INTRAVENOUS
  Administered 2018-08-18: 100 mg via INTRAVENOUS
  Administered 2018-08-18: 50 mg via INTRAVENOUS

## 2018-08-18 MED ORDER — ALBUMIN HUMAN 5 % IV SOLN
250.0000 mL | INTRAVENOUS | Status: AC | PRN
  Administered 2018-08-18: 14:00:00 12.5 g via INTRAVENOUS
  Filled 2018-08-18: qty 500

## 2018-08-18 MED ORDER — MIDAZOLAM HCL (PF) 10 MG/2ML IJ SOLN
INTRAMUSCULAR | Status: AC
  Filled 2018-08-18: qty 2

## 2018-08-18 MED ORDER — INSULIN REGULAR(HUMAN) IN NACL 100-0.9 UT/100ML-% IV SOLN
INTRAVENOUS | Status: DC
  Administered 2018-08-19: 1.8 [IU]/h via INTRAVENOUS
  Filled 2018-08-18: qty 100

## 2018-08-18 MED ORDER — LACTATED RINGERS IV SOLN
INTRAVENOUS | Status: DC | PRN
  Administered 2018-08-18: 08:00:00 via INTRAVENOUS

## 2018-08-18 MED ORDER — EPHEDRINE SULFATE-NACL 50-0.9 MG/10ML-% IV SOSY
PREFILLED_SYRINGE | INTRAVENOUS | Status: DC | PRN
  Administered 2018-08-18: 10 mg via INTRAVENOUS

## 2018-08-18 MED ORDER — MORPHINE SULFATE (PF) 2 MG/ML IV SOLN: 1.0000 mg | INTRAVENOUS | Status: DC | PRN

## 2018-08-18 MED ORDER — PROPOFOL 10 MG/ML IV BOLUS
INTRAVENOUS | Status: DC | PRN
  Administered 2018-08-18: 40 mg via INTRAVENOUS
  Administered 2018-08-18: 50 mg via INTRAVENOUS
  Administered 2018-08-18: 110 mg via INTRAVENOUS

## 2018-08-18 MED ORDER — ARTIFICIAL TEARS OPHTHALMIC OINT
TOPICAL_OINTMENT | OPHTHALMIC | Status: AC
  Filled 2018-08-18: qty 3.5

## 2018-08-18 MED ORDER — FENTANYL CITRATE (PF) 250 MCG/5ML IJ SOLN
INTRAMUSCULAR | Status: DC | PRN
  Administered 2018-08-18: 150 ug via INTRAVENOUS
  Administered 2018-08-18 (×2): 250 ug via INTRAVENOUS
  Administered 2018-08-18: 500 ug via INTRAVENOUS
  Administered 2018-08-18: 250 ug via INTRAVENOUS
  Administered 2018-08-18: 100 ug via INTRAVENOUS

## 2018-08-18 MED ORDER — METOPROLOL TARTRATE 12.5 MG HALF TABLET
12.5000 mg | ORAL_TABLET | Freq: Two times a day (BID) | ORAL | Status: DC
  Administered 2018-08-19: 12.5 mg via ORAL
  Filled 2018-08-18: qty 1

## 2018-08-18 MED ORDER — HEMOSTATIC AGENTS (NO CHARGE) OPTIME
TOPICAL | Status: DC | PRN
  Administered 2018-08-18 (×3): 1 via TOPICAL

## 2018-08-18 MED ORDER — ACETAMINOPHEN 500 MG PO TABS
1000.0000 mg | ORAL_TABLET | Freq: Four times a day (QID) | ORAL | Status: DC
  Administered 2018-08-19 – 2018-08-23 (×19): 1000 mg via ORAL
  Filled 2018-08-18 (×21): qty 2

## 2018-08-18 MED ORDER — SODIUM CHLORIDE 0.9 % IV SOLN
INTRAVENOUS | Status: DC
  Administered 2018-08-18: 13:00:00 10 mL/h via INTRAVENOUS

## 2018-08-18 MED ORDER — ALBUMIN HUMAN 5 % IV SOLN
INTRAVENOUS | Status: DC | PRN
  Administered 2018-08-18: 12:00:00 via INTRAVENOUS

## 2018-08-18 MED ORDER — PHENYLEPHRINE HCL-NACL 20-0.9 MG/250ML-% IV SOLN: 0.0000 ug/min | INTRAVENOUS | Status: DC

## 2018-08-18 MED ORDER — ORAL CARE MOUTH RINSE: 15.0000 mL | OROMUCOSAL | Status: DC

## 2018-08-18 MED ORDER — MIDAZOLAM HCL 2 MG/2ML IJ SOLN: 2.0000 mg | INTRAMUSCULAR | Status: DC | PRN

## 2018-08-18 MED ORDER — CHLORHEXIDINE GLUCONATE 0.12 % MT SOLN
OROMUCOSAL | Status: AC
  Administered 2018-08-18: 21:00:00 15 mL
  Filled 2018-08-18: qty 15

## 2018-08-18 MED ORDER — FENTANYL CITRATE (PF) 250 MCG/5ML IJ SOLN
INTRAMUSCULAR | Status: AC
  Filled 2018-08-18: qty 25

## 2018-08-18 MED ORDER — SODIUM CHLORIDE 0.9 % IV SOLN: 250.0000 mL | INTRAVENOUS | Status: DC

## 2018-08-18 MED ORDER — ACETAMINOPHEN 650 MG RE SUPP
650.0000 mg | Freq: Once | RECTAL | Status: AC
  Administered 2018-08-18: 16:00:00 650 mg via RECTAL

## 2018-08-18 MED ORDER — 0.9 % SODIUM CHLORIDE (POUR BTL) OPTIME
TOPICAL | Status: DC | PRN
  Administered 2018-08-18: 5000 mL

## 2018-08-18 MED ORDER — FAMOTIDINE IN NACL 20-0.9 MG/50ML-% IV SOLN: 20.0000 mg | Freq: Two times a day (BID) | INTRAVENOUS | Status: AC

## 2018-08-18 MED ORDER — INSULIN REGULAR BOLUS VIA INFUSION
0.0000 [IU] | Freq: Three times a day (TID) | INTRAVENOUS | Status: DC
  Filled 2018-08-18: qty 10

## 2018-08-18 MED ORDER — SODIUM CHLORIDE 0.9 % IV SOLN
1.5000 g | Freq: Two times a day (BID) | INTRAVENOUS | Status: AC
  Administered 2018-08-18 – 2018-08-20 (×4): 1.5 g via INTRAVENOUS
  Filled 2018-08-18 (×4): qty 1.5

## 2018-08-18 MED ORDER — HEPARIN SODIUM (PORCINE) 1000 UNIT/ML IJ SOLN
INTRAMUSCULAR | Status: AC
  Filled 2018-08-18: qty 1

## 2018-08-18 MED ORDER — LEVOTHYROXINE SODIUM 100 MCG PO TABS
100.0000 ug | ORAL_TABLET | Freq: Every day | ORAL | Status: DC
  Administered 2018-08-19 – 2018-08-23 (×5): 100 ug via ORAL
  Filled 2018-08-18 (×6): qty 1

## 2018-08-18 MED ORDER — METOPROLOL TARTRATE 25 MG/10 ML ORAL SUSPENSION: 12.5000 mg | Freq: Two times a day (BID) | ORAL | Status: DC

## 2018-08-18 MED ORDER — PHENYLEPHRINE 40 MCG/ML (10ML) SYRINGE FOR IV PUSH (FOR BLOOD PRESSURE SUPPORT)
PREFILLED_SYRINGE | INTRAVENOUS | Status: AC
  Filled 2018-08-18: qty 10

## 2018-08-18 MED ORDER — NITROGLYCERIN IN D5W 200-5 MCG/ML-% IV SOLN: 0.0000 ug/min | INTRAVENOUS | Status: DC

## 2018-08-18 MED ORDER — SODIUM CHLORIDE 0.9% FLUSH: 3.0000 mL | INTRAVENOUS | Status: DC | PRN

## 2018-08-18 MED ORDER — MAGNESIUM SULFATE 4 GM/100ML IV SOLN
4.0000 g | Freq: Once | INTRAVENOUS | Status: AC
  Filled 2018-08-18: qty 100

## 2018-08-18 MED ORDER — PROPOFOL 10 MG/ML IV BOLUS
INTRAVENOUS | Status: AC
  Filled 2018-08-18: qty 40

## 2018-08-18 MED ORDER — SODIUM CHLORIDE 0.9% FLUSH
3.0000 mL | Freq: Two times a day (BID) | INTRAVENOUS | Status: DC
  Administered 2018-08-19 (×2): 3 mL via INTRAVENOUS

## 2018-08-18 MED ORDER — NICARDIPINE HCL IN NACL 20-0.86 MG/200ML-% IV SOLN
3.0000 mg/h | INTRAVENOUS | Status: DC
  Administered 2018-08-18: 17:00:00 15 mg/h via INTRAVENOUS
  Administered 2018-08-18: 5 mg/h via INTRAVENOUS
  Administered 2018-08-18: 23:00:00 8 mg/h via INTRAVENOUS
  Administered 2018-08-19 (×2): 4 mg/h via INTRAVENOUS
  Administered 2018-08-19: 7 mg/h via INTRAVENOUS
  Filled 2018-08-18 (×11): qty 200

## 2018-08-18 MED ORDER — CHLORHEXIDINE GLUCONATE 0.12% ORAL RINSE (MEDLINE KIT)
15.0000 mL | Freq: Two times a day (BID) | OROMUCOSAL | Status: DC
  Administered 2018-08-18: 21:00:00 15 mL via OROMUCOSAL

## 2018-08-18 MED ORDER — PROTAMINE SULFATE 10 MG/ML IV SOLN
INTRAVENOUS | Status: DC | PRN
  Administered 2018-08-18 (×2): 50 mg via INTRAVENOUS
  Administered 2018-08-18: 40 mg via INTRAVENOUS
  Administered 2018-08-18 (×2): 50 mg via INTRAVENOUS

## 2018-08-18 MED ORDER — ASPIRIN EC 325 MG PO TBEC
325.0000 mg | DELAYED_RELEASE_TABLET | Freq: Every day | ORAL | Status: DC
  Administered 2018-08-19 – 2018-08-23 (×4): 325 mg via ORAL
  Filled 2018-08-18 (×4): qty 1

## 2018-08-18 MED ORDER — ROCURONIUM BROMIDE 10 MG/ML (PF) SYRINGE
PREFILLED_SYRINGE | INTRAVENOUS | Status: AC
  Filled 2018-08-18: qty 10

## 2018-08-18 MED ORDER — ONDANSETRON HCL 4 MG/2ML IJ SOLN
4.0000 mg | Freq: Four times a day (QID) | INTRAMUSCULAR | Status: DC | PRN
  Administered 2018-08-18 – 2018-08-21 (×5): 4 mg via INTRAVENOUS
  Filled 2018-08-18 (×5): qty 2

## 2018-08-18 MED ORDER — LACTATED RINGERS IV SOLN: 500.0000 mL | Freq: Once | INTRAVENOUS | Status: DC | PRN

## 2018-08-18 MED ORDER — PLASMA-LYTE 148 IV SOLN
INTRAVENOUS | Status: DC | PRN
  Administered 2018-08-18: 500 mL via INTRAVASCULAR

## 2018-08-18 MED ORDER — HEPARIN SODIUM (PORCINE) 1000 UNIT/ML IJ SOLN
INTRAMUSCULAR | Status: DC | PRN
  Administered 2018-08-18: 24000 [IU] via INTRAVENOUS

## 2018-08-18 MED ORDER — DEXMEDETOMIDINE HCL IN NACL 200 MCG/50ML IV SOLN: 0.0000 ug/kg/h | INTRAVENOUS | Status: DC

## 2018-08-18 MED ORDER — ALLOPURINOL 100 MG PO TABS
100.0000 mg | ORAL_TABLET | Freq: Every day | ORAL | Status: DC
  Administered 2018-08-19 – 2018-08-23 (×5): 100 mg via ORAL
  Filled 2018-08-18 (×5): qty 1

## 2018-08-18 MED ORDER — LACTATED RINGERS IV SOLN: INTRAVENOUS | Status: DC

## 2018-08-18 MED ORDER — SODIUM BICARBONATE 8.4 % IV SOLN
50.0000 meq | Freq: Once | INTRAVENOUS | Status: AC
  Administered 2018-08-18: 19:00:00 50 meq via INTRAVENOUS

## 2018-08-18 MED ORDER — ORAL CARE MOUTH RINSE
15.0000 mL | Freq: Two times a day (BID) | OROMUCOSAL | Status: DC
  Administered 2018-08-19 – 2018-08-23 (×7): 15 mL via OROMUCOSAL

## 2018-08-18 MED ORDER — PROTAMINE SULFATE 10 MG/ML IV SOLN
INTRAVENOUS | Status: AC
  Filled 2018-08-18: qty 25

## 2018-08-18 MED ORDER — TRAMADOL HCL 50 MG PO TABS
50.0000 mg | ORAL_TABLET | ORAL | Status: DC | PRN
  Administered 2018-08-19 – 2018-08-21 (×5): 100 mg via ORAL
  Filled 2018-08-18 (×6): qty 2

## 2018-08-18 MED ORDER — POTASSIUM CHLORIDE 10 MEQ/50ML IV SOLN: 10.0000 meq | INTRAVENOUS | Status: AC

## 2018-08-18 MED ORDER — FENTANYL CITRATE (PF) 250 MCG/5ML IJ SOLN
INTRAMUSCULAR | Status: AC
  Filled 2018-08-18: qty 5

## 2018-08-18 MED ORDER — VANCOMYCIN HCL IN DEXTROSE 1-5 GM/200ML-% IV SOLN
1000.0000 mg | Freq: Once | INTRAVENOUS | Status: AC
  Administered 2018-08-18: 21:00:00 1000 mg via INTRAVENOUS
  Filled 2018-08-18: qty 200

## 2018-08-18 MED ORDER — DOCUSATE SODIUM 100 MG PO CAPS
200.0000 mg | ORAL_CAPSULE | Freq: Every day | ORAL | Status: DC
  Administered 2018-08-19 – 2018-08-22 (×3): 200 mg via ORAL
  Filled 2018-08-18 (×4): qty 2

## 2018-08-18 MED ORDER — ACETAMINOPHEN 160 MG/5ML PO SOLN: 650.0000 mg | Freq: Once | ORAL | Status: AC

## 2018-08-18 MED ORDER — PANTOPRAZOLE SODIUM 40 MG PO TBEC
40.0000 mg | DELAYED_RELEASE_TABLET | Freq: Every day | ORAL | Status: DC
  Administered 2018-08-20 – 2018-08-23 (×4): 40 mg via ORAL
  Filled 2018-08-18 (×4): qty 1

## 2018-08-18 MED ORDER — MIDAZOLAM HCL 5 MG/5ML IJ SOLN
INTRAMUSCULAR | Status: DC | PRN
  Administered 2018-08-18: 3 mg via INTRAVENOUS
  Administered 2018-08-18 (×2): 2 mg via INTRAVENOUS
  Administered 2018-08-18: 3 mg via INTRAVENOUS

## 2018-08-18 MED ORDER — METOPROLOL TARTRATE 5 MG/5ML IV SOLN
2.5000 mg | INTRAVENOUS | Status: DC | PRN
  Administered 2018-08-18 – 2018-08-19 (×3): 5 mg via INTRAVENOUS
  Administered 2018-08-20: 13:00:00 2.5 mg via INTRAVENOUS
  Filled 2018-08-18 (×4): qty 5

## 2018-08-18 MED ORDER — CHLORHEXIDINE GLUCONATE 0.12 % MT SOLN
15.0000 mL | OROMUCOSAL | Status: AC
  Administered 2018-08-18: 14:00:00 15 mL via OROMUCOSAL

## 2018-08-18 MED ORDER — LACTATED RINGERS IV SOLN
INTRAVENOUS | Status: DC | PRN
  Administered 2018-08-18 (×2): via INTRAVENOUS

## 2018-08-18 MED ORDER — ACETAMINOPHEN 160 MG/5ML PO SOLN: 1000.0000 mg | Freq: Four times a day (QID) | ORAL | Status: DC

## 2018-08-18 MED ORDER — SODIUM CHLORIDE 0.45 % IV SOLN: INTRAVENOUS | Status: DC | PRN

## 2018-08-18 MED ORDER — SODIUM CHLORIDE 0.9 % IV SOLN
INTRAVENOUS | Status: DC | PRN
  Administered 2018-08-18: 25 ug/min via INTRAVENOUS

## 2018-08-18 MED ORDER — LACTATED RINGERS IV SOLN
INTRAVENOUS | Status: DC | PRN
  Administered 2018-08-18: 07:00:00 via INTRAVENOUS

## 2018-08-18 MED ORDER — ASPIRIN 81 MG PO CHEW
324.0000 mg | CHEWABLE_TABLET | Freq: Every day | ORAL | Status: DC
  Administered 2018-08-22: 10:00:00 324 mg
  Filled 2018-08-18 (×2): qty 4

## 2018-08-18 SURGICAL SUPPLY — 85 items
BAG DECANTER FOR FLEXI CONT (MISCELLANEOUS) ×8 IMPLANT
BASKET HEART  (ORDER IN 25'S) (MISCELLANEOUS) ×1
BASKET HEART (ORDER IN 25'S) (MISCELLANEOUS) ×1
BASKET HEART (ORDER IN 25S) (MISCELLANEOUS) ×2 IMPLANT
BLADE CLIPPER SURG (BLADE) ×4 IMPLANT
BLADE STERNUM SYSTEM 6 (BLADE) ×4 IMPLANT
BNDG ELASTIC 4X5.8 VLCR STR LF (GAUZE/BANDAGES/DRESSINGS) ×4 IMPLANT
BNDG ELASTIC 6X5.8 VLCR STR LF (GAUZE/BANDAGES/DRESSINGS) ×4 IMPLANT
BNDG GAUZE ELAST 4 BULKY (GAUZE/BANDAGES/DRESSINGS) ×4 IMPLANT
CANISTER SUCT 3000ML PPV (MISCELLANEOUS) ×4 IMPLANT
CANNULA EZ GLIDE AORTIC 21FR (CANNULA) ×8 IMPLANT
CATH CPB KIT HENDRICKSON (MISCELLANEOUS) ×4 IMPLANT
CLIP RETRACTION 3.0MM CORONARY (MISCELLANEOUS) ×4 IMPLANT
CLIP VESOCCLUDE MED 24/CT (CLIP) IMPLANT
CLIP VESOCCLUDE SM WIDE 24/CT (CLIP) IMPLANT
COVER WAND RF STERILE (DRAPES) ×4 IMPLANT
DERMABOND ADHESIVE PROPEN (GAUZE/BANDAGES/DRESSINGS) ×4
DERMABOND ADVANCED .7 DNX6 (GAUZE/BANDAGES/DRESSINGS) ×4 IMPLANT
DRAIN CHANNEL 19F RND (DRAIN) IMPLANT
DRAPE CARDIOVASCULAR INCISE (DRAPES) ×2
DRAPE INCISE IOBAN 66X45 STRL (DRAPES) ×4 IMPLANT
DRAPE SLUSH/WARMER DISC (DRAPES) ×4 IMPLANT
DRAPE SRG 135X102X78XABS (DRAPES) ×2 IMPLANT
DRSG COVADERM 4X14 (GAUZE/BANDAGES/DRESSINGS) ×4 IMPLANT
ELECT REM PT RETURN 9FT ADLT (ELECTROSURGICAL) ×8
ELECTRODE REM PT RTRN 9FT ADLT (ELECTROSURGICAL) ×4 IMPLANT
FELT TEFLON 1X6 (MISCELLANEOUS) ×8 IMPLANT
GAUZE SPONGE 4X4 12PLY STRL (GAUZE/BANDAGES/DRESSINGS) ×8 IMPLANT
GAUZE SPONGE 4X4 12PLY STRL LF (GAUZE/BANDAGES/DRESSINGS) ×12 IMPLANT
GLOVE BIO SURGEON STRL SZ7 (GLOVE) ×8 IMPLANT
GLOVE BIOGEL PI IND STRL 7.5 (GLOVE) ×4 IMPLANT
GLOVE BIOGEL PI INDICATOR 7.5 (GLOVE) ×4
GOWN STRL REUS W/ TWL LRG LVL3 (GOWN DISPOSABLE) ×8 IMPLANT
GOWN STRL REUS W/ TWL XL LVL3 (GOWN DISPOSABLE) ×4 IMPLANT
GOWN STRL REUS W/TWL LRG LVL3 (GOWN DISPOSABLE) ×8
GOWN STRL REUS W/TWL XL LVL3 (GOWN DISPOSABLE) ×4
HEMOSTAT POWDER SURGIFOAM 1G (HEMOSTASIS) ×12 IMPLANT
KIT BASIN OR (CUSTOM PROCEDURE TRAY) ×4 IMPLANT
KIT DRAINAGE VACCUM ASSIST (KITS) ×4 IMPLANT
KIT SUCTION CATH 14FR (SUCTIONS) ×12 IMPLANT
KIT TURNOVER KIT B (KITS) ×4 IMPLANT
KIT VASOVIEW HEMOPRO 2 VH 4000 (KITS) ×4 IMPLANT
LEAD PACING MYOCARDI (MISCELLANEOUS) ×4 IMPLANT
MARKER GRAFT CORONARY BYPASS (MISCELLANEOUS) ×12 IMPLANT
NS IRRIG 1000ML POUR BTL (IV SOLUTION) ×20 IMPLANT
PACK E OPEN HEART (SUTURE) ×4 IMPLANT
PACK OPEN HEART (CUSTOM PROCEDURE TRAY) ×4 IMPLANT
PAD ARMBOARD 7.5X6 YLW CONV (MISCELLANEOUS) ×8 IMPLANT
PAD ELECT DEFIB RADIOL ZOLL (MISCELLANEOUS) ×4 IMPLANT
PENCIL BUTTON HOLSTER BLD 10FT (ELECTRODE) ×4 IMPLANT
POSITIONER HEAD DONUT 9IN (MISCELLANEOUS) ×4 IMPLANT
PUNCH AORTIC ROTATE 4.0MM (MISCELLANEOUS) ×4 IMPLANT
PUNCH AORTIC ROTATE 4.5MM 8IN (MISCELLANEOUS) IMPLANT
PUNCH AORTIC ROTATE 5MM 8IN (MISCELLANEOUS) IMPLANT
SET CARDIOPLEGIA MPS 5001102 (MISCELLANEOUS) ×4 IMPLANT
SPONGE LAP 18X18 RF (DISPOSABLE) IMPLANT
SPONGE LAP 4X18 RFD (DISPOSABLE) IMPLANT
SUT BONE WAX W31G (SUTURE) ×4 IMPLANT
SUT MNCRL AB 3-0 PS2 18 (SUTURE) ×8 IMPLANT
SUT MNCRL AB 4-0 PS2 18 (SUTURE) ×12 IMPLANT
SUT PDS AB 1 CTX 36 (SUTURE) ×8 IMPLANT
SUT PROLENE 2 0 SH DA (SUTURE) IMPLANT
SUT PROLENE 3 0 SH DA (SUTURE) ×4 IMPLANT
SUT PROLENE 3 0 SH1 36 (SUTURE) IMPLANT
SUT PROLENE 4 0 RB 1 (SUTURE)
SUT PROLENE 4 0 SH DA (SUTURE) IMPLANT
SUT PROLENE 4-0 RB1 .5 CRCL 36 (SUTURE) IMPLANT
SUT PROLENE 5 0 C 1 36 (SUTURE) ×12 IMPLANT
SUT PROLENE 6 0 C 1 30 (SUTURE) IMPLANT
SUT PROLENE 7 0 BV 1 (SUTURE) ×8 IMPLANT
SUT PROLENE 8 0 BV175 6 (SUTURE) IMPLANT
SUT PROLENE BLUE 7 0 (SUTURE) ×4 IMPLANT
SUT PROLENE POLY MONO (SUTURE) IMPLANT
SUT STEEL 6MS V (SUTURE) ×8 IMPLANT
SUT VIC AB 2-0 CT1 27 (SUTURE) ×2
SUT VIC AB 2-0 CT1 TAPERPNT 27 (SUTURE) ×2 IMPLANT
SUT VIC AB 3-0 X1 27 (SUTURE) ×4 IMPLANT
SYSTEM SAHARA CHEST DRAIN ATS (WOUND CARE) ×4 IMPLANT
TAPE CLOTH SURG 4X10 WHT LF (GAUZE/BANDAGES/DRESSINGS) ×8 IMPLANT
TOWEL GREEN STERILE (TOWEL DISPOSABLE) ×4 IMPLANT
TOWEL GREEN STERILE FF (TOWEL DISPOSABLE) ×4 IMPLANT
TRAY FOLEY SLVR 16FR TEMP STAT (SET/KITS/TRAYS/PACK) ×4 IMPLANT
TUBING LAP HI FLOW INSUFFLATIO (TUBING) ×4 IMPLANT
UNDERPAD 30X30 (UNDERPADS AND DIAPERS) ×4 IMPLANT
WATER STERILE IRR 1000ML POUR (IV SOLUTION) ×8 IMPLANT

## 2018-08-18 NOTE — Anesthesia Procedure Notes (Signed)
Arterial Line Insertion Start/End8/13/2020 7:12 AM, 08/18/2018 7:12 AM Performed by: Josephine Igo, CRNA  Patient location: Pre-op. Preanesthetic checklist: patient identified, IV checked, site marked, risks and benefits discussed, surgical consent, monitors and equipment checked, pre-op evaluation, timeout performed and anesthesia consent Left, radial was placed Catheter size: 20 G Hand hygiene performed , maximum sterile barriers used  and Seldinger technique used Allen's test indicative of satisfactory collateral circulation Attempts: 1 Procedure performed without using ultrasound guided technique. Following insertion, dressing applied and Biopatch. Post procedure assessment: normal

## 2018-08-18 NOTE — Progress Notes (Signed)
     [address]       [place] [address]             [phone]      No events  Vitals:   08/17/18 2000 08/18/18 0455  BP: (!) 149/84 (!) 142/79  Pulse: 86   Resp: 19 15  Temp: 98.4 F (36.9 C) 98.1 F (36.7 C)  SpO2: 99%    Alert NAD RRR EWOB  CAD NSTEMI OR today for CABG 2-3  Taraya Steward O Crysten Kaman

## 2018-08-18 NOTE — Transfer of Care (Signed)
Immediate Anesthesia Transfer of Care Note  Patient: Joseph Liu  Procedure(s) Performed: CORONARY ARTERY BYPASS GRAFTING (CABG) times three using left internal mammary artery and right saphenous vein. Aborted attempt left leg vein harvest. (N/A Chest) TRANSESOPHAGEAL ECHOCARDIOGRAM (TEE) (N/A )  Patient Location: SICU  Anesthesia Type:General  Level of Consciousness: sedated and Patient remains intubated per anesthesia plan  Airway & Oxygen Therapy: Patient remains intubated per anesthesia plan and Patient placed on Ventilator (see vital sign flow sheet for setting)  Post-op Assessment: Report given to RN and Post -op Vital signs reviewed and stable  Post vital signs: Reviewed and stable  Last Vitals:  Vitals Value Taken Time  BP 120/62 08/18/18 1320  Temp 35.5 C 08/18/18 1331  Pulse 80 08/18/18 1331  Resp 20 08/18/18 1331  SpO2 100 % 08/18/18 1331  Vitals shown include unvalidated device data.  Last Pain:  Vitals:   08/18/18 0455  TempSrc: Oral  PainSc: 0-No pain         Complications: No apparent anesthesia complications

## 2018-08-18 NOTE — OR Nursing (Deleted)
1st call to SICU 

## 2018-08-18 NOTE — Anesthesia Procedure Notes (Signed)
Procedure Name: Intubation Date/Time: 08/18/2018 8:44 AM Performed by: Renato Shin, CRNA Pre-anesthesia Checklist: Patient identified, Emergency Drugs available, Suction available and Patient being monitored Patient Re-evaluated:Patient Re-evaluated prior to induction Oxygen Delivery Method: Circle system utilized Preoxygenation: Pre-oxygenation with 100% oxygen Induction Type: IV induction Ventilation: Mask ventilation without difficulty Laryngoscope Size: Miller Grade View: Grade I Tube type: Oral Tube size: 8.0 mm Number of attempts: 1 Airway Equipment and Method: Stylet and Oral airway Placement Confirmation: ETT inserted through vocal cords under direct vision,  positive ETCO2 and breath sounds checked- equal and bilateral Secured at: 22 cm Tube secured with: Tape Dental Injury: Teeth and Oropharynx as per pre-operative assessment

## 2018-08-18 NOTE — Progress Notes (Signed)
  Echocardiogram Echocardiogram Transesophageal has been performed.  Joseph Liu 08/18/2018, 9:36 AM

## 2018-08-18 NOTE — OR Nursing (Signed)
2nd call toSICU 1258.

## 2018-08-18 NOTE — OR Nursing (Signed)
SICU 1st call @1222 .

## 2018-08-18 NOTE — Discharge Summary (Signed)
Physician Discharge Summary       [address]       [place] [id number]             [phone]    Patient ID: Joseph Liu MRN: [id number] DOB/AGE: 67/05/1950 67 y.o.  Admit date: 08/16/2018 Discharge date: 08/23/2018  Admission Diagnoses: 1.  Hypertensive urgency 2. Symptomatic bradycardia 3. NSTEMI (non-ST elevated myocardial infarction) (HCC) 4. Coronary artery disease  Discharge Diagnoses:  1. S/P CABG x 3 2. 3. History of Diabetes Mellitus (HCC) 4. History of Chronic renal insufficiency, stage 3 (moderate) (HCC) 5. History of hypertension 6. History of Hypothyroidism 7. History of GERD (gastroesophageal reflux disease) 8. History of gout   Procedure (s):  CABG X 3.  LIMA LAD, RSVG D1, D2.  D1 proximal graft y-to D2 proximal vein graft endoscopic greater saphenous vein harvest on the right intra-operative transesophageal Echocardiogram by Dr. Cliffton AstersLightfoot on 08/18/2018.  History of Presenting Illness: This is a 68 yo male transferred from [facility] with CAD, CKD, and symptomatic bradycardia.  He originally presented with nausea and vomiting, and was noted to be in HTN urgency, and bradycardic.  He ruled in for a NSTEMI and cardiac evaluation revealed the CAD.  He was transferred to [facility] for surgical evaluation. Pre operative carotid duplex US showed no significant internal carotid artery stenosis bilaterally. Dr.Lightfoot discussed with patient the need for coronary artery bypass grafting surgery. He underwent a CABG x 3 on 08/18/2018.  Brief Hospital Course:  The patient was extubated the evening of surgery without difficulty. He remained afebrile and hemodynamically stable. He was weaned off Cardene and Nitro drips. Theone MurdochSwan Ganz, a line, chest tubes, and foley were removed early in the post operative course. Lopressor was started and titrated accordingly. He was volume over loaded and diuresed. He had ABL anemia. He did not require a post op  transfusion. Last H and H was 9.1 and 29.7. He was started on Lisinopril for better BP control. Patient did not have much appetite so he was placed on Megace initially. This was stopped and he was then placed on Marinol. He was weaned off the insulin drip.  The patient's glucose remained well controlled. The patient's HGA1C pre op was  9.5. I had a discussion with patient regarding the importance of strict glucose control and close medical follow up after discharge for his diabetes management. [place] in [place] which he will have to do. I am going to ask outpatient diabetes to follow up with him as well. The patient was felt surgically stable for transfer from the ICU to [facility] for further convalescence for the past several days;however, there has been no bed available so he remained in the ICU.  He continues to progress with cardiac rehab. He did go into a fib with RVR mid morning on 08/17. His Lopressor was increased to 50 mg bid. He converted to ST then SR. As discussed with Dr. Cliffton AstersLightfoot, no Amiodarone unless he went back into a fib, which he did not. He was ambulating on room air. He has been tolerating a diet and has had a bowel movement. Epicardial pacing wires have already been removed.  We will continue Lasix 40 mg daily and a potassium supplement for several more days. As discussed with Dr. Cliffton AstersLightfoot, the patient is felt surgically stable for discharge today.   Latest Vital Signs: Blood pressure (!) 142/77, pulse (!) 108, temperature 98.8 F (  37.1 C), temperature source Oral, resp. rate 20, height 5\' 8"  (1.727 m), weight 73.5 kg, SpO2 96 %.  Physical Exam: General appearance: alert, cooperative and no distress Heart: Slightly tachycardic Lungs: clear to auscultation bilaterally Abdomen: Soft, non tender, bowel sounds present Extremities: Trace LE edema Wounds: Cleans and dry  Discharge Condition:Stable and discharged to home  Recent  laboratory studies:  Lab Results  Component Value Date   WBC 6.8 08/20/2018   HGB 9.1 (L) 08/20/2018   HCT 29.7 (L) 08/20/2018   MCV 88.7 08/20/2018   PLT 227 08/20/2018   Lab Results  Component Value Date   NA 137 08/22/2018   K 4.2 08/22/2018   CL 104 08/22/2018   CO2 24 08/22/2018   CREATININE 1.44 (H) 08/22/2018   GLUCOSE 146 (H) 08/22/2018      Diagnostic Studies: Dg Chest 2 View  Result Date: 08/21/2018 CLINICAL DATA:  Three days postop CABG. EXAM: CHEST - 2 VIEW COMPARISON:  Chest x-rays dated 08/20/2018 and 08/19/2018 FINDINGS: Stable cardiomegaly. Median sternotomy wires appear intact and stable in alignment status post CABG. Opacity at the posterior costophrenic angle, only visible on the lateral projection, likely atelectasis and/or small pleural effusion. IMPRESSION: 1. Opacity at the posterior costophrenic angle, only visible on the lateral projection, likely atelectasis and/or small pleural effusion. 2. Stable cardiomegaly. Electronically Signed   By: Bary RichardStan  Maynard M.D.   On: 08/21/2018 10:03   Dg Chest 2 View  Result Date: 08/17/2018 CLINICAL DATA:  Preop for coronary artery bypass graft. EXAM: CHEST - 2 VIEW COMPARISON:  None. FINDINGS: The heart size and mediastinal contours are within normal limits. Both lungs are clear. The visualized skeletal structures are unremarkable. IMPRESSION: No active cardiopulmonary disease. Electronically Signed   By: Lupita RaiderJames  Green Jr M.D.   On: 08/17/2018 13:50   Dg Chest Port 1 View  Result Date: 08/20/2018 CLINICAL DATA:  Pleural effusion. Diabetes. Hypertension. EXAM: PORTABLE CHEST 1 VIEW COMPARISON:  08/19/2018 FINDINGS: The Swan-Ganz catheter and left chest tube or mediastinal drain have been removed since prior study. No pneumothorax visualized. No evidence of pleural effusion. Resolution of bibasilar atelectasis since prior study. IMPRESSION: No evidence of pneumothorax or active lung disease. Stable cardiomegaly. Electronically  Signed   By: Danae OrleansJohn A Stahl M.D.   On: 08/20/2018 09:59   Dg Chest Port 1 View  Result Date: 08/19/2018 CLINICAL DATA:  Status post coronary bypass grafting EXAM: PORTABLE CHEST 1 VIEW COMPARISON:  08/18/2018 FINDINGS: Endotracheal tube and gastric catheter have been removed in the interval. Swan-Ganz catheter is noted within the right pulmonary artery. Left thoracostomy catheter is again seen without pneumothorax mild bibasilar atelectasis is seen. IMPRESSION: Mild bibasilar atelectasis. No evidence of pneumothorax. Electronically Signed   By: Alcide CleverMark  Lukens M.D.   On: 08/19/2018 07:28   Dg Chest Port 1 View  Result Date: 08/18/2018 CLINICAL DATA:  Postoperative CABG EXAM: PORTABLE CHEST 1 VIEW COMPARISON:  08/17/2018 FINDINGS: Interval postsurgical changes from CABG. Median sternotomy wires. Right IJ approach Swan-Ganz catheter terminates at the expected location of the main pulmonary trunk. Endotracheal tube terminates approximately 3.8 cm superior to the carina. An enteric tube courses below the diaphragm, distal tip beyond the inferior margin of the film. Mediastinal drain noted. A left basilar chest tube is present. Heart size is within normal limits. Pulmonary vasculature is nondilated. Lungs are clear. No pneumothorax. IMPRESSION: 1. Expected postoperative changes from CABG.  No pneumothorax. 2. Support apparatus in satisfactory positioning. Electronically Signed   By: Janyth PupaNicholas  Plundo M.D.   On: 08/18/2018 14:39   Doppler Pre Cabg  Result Date: 08/18/2018 PREOPERATIVE VASCULAR EVALUATION  Indications:  Pre-surgical evaluation. Risk Factors: Hypertension, hyperlipidemia, Diabetes, coronary artery disease. Performing Technologist: Gertie Fey MHA, RDMS, RVT, RDCS  Examination Guidelines: A complete evaluation includes B-mode imaging, spectral Doppler, color Doppler, and power Doppler as needed of all accessible portions of each vessel. Bilateral testing is considered an integral part of a  complete examination. Limited examinations for reoccurring indications may be performed as noted.  Right Carotid Findings: +----------+--------+--------+--------+--------------------------+--------+           PSV cm/sEDV cm/sStenosisDescribe                  Comments +----------+--------+--------+--------+--------------------------+--------+ CCA Prox  93      20                                                 +----------+--------+--------+--------+--------------------------+--------+ CCA Distal83      21              heterogenous and smooth            +----------+--------+--------+--------+--------------------------+--------+ ICA Prox  81      14              heterogenous and irregular         +----------+--------+--------+--------+--------------------------+--------+ ICA Distal103     29                                                 +----------+--------+--------+--------+--------------------------+--------+ ECA       109     9               heterogenous and irregular         +----------+--------+--------+--------+--------------------------+--------+ Portions of this table do not appear on this page. +----------+--------+-------+----------------+------------+           PSV cm/sEDV cmsDescribe        Arm Pressure +----------+--------+-------+----------------+------------+ Subclavian85             Multiphasic, WNL             +----------+--------+-------+----------------+------------+ +---------+--------+--+--------+--+---------+ VertebralPSV cm/s84EDV cm/s14Antegrade +---------+--------+--+--------+--+---------+ Left Carotid Findings: +----------+-------+-------+--------+---------------------------------+--------+           PSV    EDV    StenosisDescribe                         Comments           cm/s   cm/s                                                     +----------+-------+-------+--------+---------------------------------+--------+  CCA Prox  87     17             smooth and heterogenous                   +----------+-------+-------+--------+---------------------------------+--------+ CCA Distal96     21             smooth and heterogenous                   +----------+-------+-------+--------+---------------------------------+--------+  ICA Prox  146    29             heterogenous, irregular and                                               calcific                                  +----------+-------+-------+--------+---------------------------------+--------+ ICA Distal107    35                                                       +----------+-------+-------+--------+---------------------------------+--------+ ECA       167    11             heterogenous, calcific and                                                irregular                                 +----------+-------+-------+--------+---------------------------------+--------+ +----------+--------+--------+----------------+------------+ SubclavianPSV cm/sEDV cm/sDescribe        Arm Pressure +----------+--------+--------+----------------+------------+           174             Multiphasic, WNL160          +----------+--------+--------+----------------+------------+ +---------+--------+--+--------+-+---------+ VertebralPSV cm/s53EDV cm/s9Antegrade +---------+--------+--+--------+-+---------+  ABI Findings: +--------+------------------+-----+---------+----------------------------------+ Right   Rt Pressure (mmHg)IndexWaveform Comment                            +--------+------------------+-----+---------+----------------------------------+ Brachial                       triphasicPressure not obtained due to IV                                            location                           +--------+------------------+-----+---------+----------------------------------+ PTA                             triphasic                                   +--------+------------------+-----+---------+----------------------------------+ DP                             triphasic                                   +--------+------------------+-----+---------+----------------------------------+ +--------+------------------+-----+---------+-------+ Left  Lt Pressure (mmHg)IndexWaveform Comment +--------+------------------+-----+---------+-------+ Brachial160                    triphasic        +--------+------------------+-----+---------+-------+ PTA                            triphasic        +--------+------------------+-----+---------+-------+ DP                             triphasic        +--------+------------------+-----+---------+-------+  Right Doppler Findings: +--------+--------+-----+---------+----------------------------------------+ Site    PressureIndexDoppler  Comments                                 +--------+--------+-----+---------+----------------------------------------+ Brachial             triphasicPressure not obtained due to IV location +--------+--------+-----+---------+----------------------------------------+ Radial               triphasic                                         +--------+--------+-----+---------+----------------------------------------+ Ulnar                triphasic                                         +--------+--------+-----+---------+----------------------------------------+  Left Doppler Findings: +--------+--------+-----+---------+--------+ Site    PressureIndexDoppler  Comments +--------+--------+-----+---------+--------+ Brachial160          triphasic         +--------+--------+-----+---------+--------+ Radial               triphasic         +--------+--------+-----+---------+--------+ Ulnar                triphasic         +--------+--------+-----+---------+--------+  Summary: Right Carotid:  Velocities in the right ICA are consistent with a 1-39% stenosis. Left Carotid: Velocities in the left ICA are consistent with a 1-39% stenosis. Vertebrals:  Bilateral vertebral arteries demonstrate antegrade flow. Subclavians: Normal flow hemodynamics were seen in bilateral subclavian              arteries. Right Upper Extremity: Doppler waveforms remain within normal limits with right radial compression. Doppler waveform obliterate with right ulnar compression. Left Upper Extremity: Doppler waveforms remain within normal limits with left radial compression. Doppler waveform obliterate with left ulnar compression.  Electronically signed by Curt Jews MD on 08/18/2018 at 7:12:04 AM.    Final       Discharge Medications: Allergies as of 08/23/2018      Reactions   Codeine Nausea Only   Sudafed [pseudoephedrine Hcl] Other (See Comments)   Renal problems      Medication List    STOP taking these medications   chlorthalidone 25 MG tablet Commonly known as: HYGROTON   diltiazem 300 MG 24 hr capsule Commonly known as: CARDIZEM CD   glimepiride 4 MG tablet Commonly known as: AMARYL   losartan 100 MG tablet Commonly known as: COZAAR     TAKE these medications   acetaminophen 500 MG tablet Commonly known as: TYLENOL Take  1,000 mg by mouth every 6 (six) hours as needed for mild pain.   allopurinol 100 MG tablet Commonly known as: ZYLOPRIM Take 100 mg by mouth daily.   aspirin 325 MG EC tablet Take 1 tablet (325 mg total) by mouth daily.   atorvastatin 40 MG tablet Commonly known as: LIPITOR Take 1 tablet (40 mg total) by mouth daily. What changed:   medication strength  how much to take   cholecalciferol 25 MCG (1000 UT) tablet Commonly known as: VITAMIN D3 Take 1,000 Units by mouth daily.   furosemide 40 MG tablet Commonly known as: LASIX Take 1 tablet (40 mg total) by mouth daily. For 5 days then stop.   glipiZIDE 10 MG 24 hr tablet Commonly known as: GLUCOTROL XL  Take 10 mg by mouth 2 (two) times daily with a meal.   levothyroxine 100 MCG tablet Commonly known as: SYNTHROID Take 100 mcg by mouth daily before breakfast.   lisinopril 5 MG tablet Commonly known as: ZESTRIL Take 1 tablet (5 mg total) by mouth daily.   metoprolol tartrate 50 MG tablet Commonly known as: LOPRESSOR Take 1 tablet (50 mg total) by mouth 2 (two) times daily. What changed:   medication strength  how much to take   NovoLIN 70/30 FlexPen (70-30) 100 UNIT/ML PEN Generic drug: Insulin Isophane & Regular Human Inject 25 Units into the skin 2 (two) times daily after a meal.   Potassium Chloride ER 20 MEQ Tbcr Take 20 mEq by mouth daily. For 5 days then stop.   traMADol 50 MG tablet Commonly known as: ULTRAM Take 1 tablet (50 mg total) by mouth every 4 (four) hours as needed for moderate pain.      The patient has been discharged on:   1.Beta Blocker:  Yes [ x  ]                              No   [   ]                              If No, reason:  2.Ace Inhibitor/ARB: Yes [x   ]                                     No  [    ]                                     If No, reason:  3.Statin:   Yes [ x  ]                  No  [   ]                  If No, reason:  4.Ecasa:  Yes  [  x ]                  No   [   ]                  If No, reason:  Follow Up Appointments: Follow-up Information    Corliss SkainsLightfoot, Harrell O, MD. Go on 08/26/2018.   Specialty: Thoracic Surgery Why: PA/LAT CXR to be  taken (at [facility] which is in the same building as Dr. Lucilla Lame office) on 08/21 at 12:30 pm;Appointment time is at 1:00 pm Contact information: [address] [place] [address] [phone]        Chana Bode, DO. Call.   Specialty: Family Medicine Why: for a follow up appointment regarding further diabetes management and surveillance of HGA1C 9.5        Jodelle Red, MD. Go on 09/07/2018.   Specialty: Cardiology Why:  Appointment is at 3:40 pm Contact information: [address] [place] [address] [phone]           Signed: Lelon Huh [facility] 08/23/2018, 8:32 AM

## 2018-08-18 NOTE — Brief Op Note (Signed)
08/16/2018 - 08/18/2018  11:31 AM  PATIENT:  [name] Tursi  68 y.o. male  PRE-OPERATIVE DIAGNOSIS:  Coronary artery disease  POST-OPERATIVE DIAGNOSIS:  Coronary artery disease  PROCEDURE: TRANSESOPHAGEAL ECHOCARDIOGRAM (TEE),  MEDIAN STERNOTOMY for CORONARY ARTERY BYPASS GRAFTING (CABG) times 3 (LIMA to LAD, SVG to DIAGONAL 1, SVG to DIAGONAL 2) using left internal mammary artery and right saphenous vein andEVH from the Right GREATER SAPHENOUS VEIN  SURGEON:  Surgeon(s) and Role:    Lightfoot, Lucile Crater, MD - Primary  PHYSICIAN ASSISTANT: Lars Pinks PA-C  ANESTHESIA:   general  EBL:  Per perfusion and anesthesia record  DRAINS: Chest tubes placed in the mediastinal and pleural spaces   COUNTS CORRECT:  YES  DICTATION: .Dragon Dictation  PLAN OF CARE: Admit to inpatient   PATIENT DISPOSITION:  ICU - intubated and hemodynamically stable.   Delay start of Pharmacological VTE agent (>24hrs) due to surgical blood loss or risk of bleeding: yes  BASELINE WEIGHT: 78 kg

## 2018-08-18 NOTE — Discharge Instructions (Signed)

## 2018-08-18 NOTE — Op Note (Signed)
08/18/2018 Patient:  Joseph Liu Pre-Op Dx: NSTEMI, 2V CAD, DM   Post-op Dx:  same Procedure: CABG X 3.  LIMA LAD, RSVG D1, D2.  D1 proximal graft y-to D2 proximal vein graft Endoscopic greater saphenous vein harvest on the right Intra-operative Transesophageal Echocardiogram  Surgeon and Role:      * Zaydn Gutridge, Lucile Crater, MD - Primary    Josie Saunders, PA-C - assisting  Anesthesia  general EBL:  519ml Blood Administration: none Xclamp Time:  42 min Pump Time:  16min  Drains: 90 F blake drain: L, mediastinal  Wires: ventricular wires Counts: correct   Indications: 68 yo male transferred from [facility] hospital with CAD, CKD, and symptomatic bradycardia.  He originally presented with nausea and vomiting, and was noted to be in HTN urgency, and bradycardic.  Cardiac evaluation revealed the CAD.  He was transferred to [facility] for surgical evaluation  Findings: Good LIMA.  Very small vein.  Good D1 target, small D2 target, calcified LAD target.  The circumflex branches were too small for grafting.  Flows on D1 wer 15-20, but this was the smallest segment of vein.  Flows >35 on D2 target.  The D1 vein was too small to anastomose to the aorta, so it was jumped off the hood of the D2 vein graft.  Operative Technique: All invasive lines were placed in pre-op holding.  After the risks, benefits and alternatives were thoroughly discussed, the patient was brought to the operative theatre.  Anesthesia was induced, and the patient was prepped and draped in normal sterile fashion.  An appropriate surgical pause was performed, and pre-operative antibiotics were dosed accordingly.  We began with simultaneous incisions were made along the right leg for harvesting of the greater saphenous vein and the chest for the sternotomy.  The vein was small, so we explored the left thigh, but did not find any good vein on that side either.  In regards to the sternotomy, this was carried down with bovie  cautery, and the sternum was divided with a reciprocating saw.  Meticulous hemostasis was obtained.  The left internal thoracic artery was exposed and harvested in in pedicled fashion.  The patient was systemically heparinized, and the artery was divided distally, and placed in a papaverine sponge.    The sternal elevator was removed, and a retractor was placed.  The pericardium was divided in the midline and fashioned into a cradle with pericardial stitches.   After we confirmed an appropriate ACT, the ascending aorta was cannulated in standard fashion.  The right atrial appendage was used for venous cannulation site.  Cardiopulmonary bypass was initiated, and the heart retractor was placed. The cross clamp was applied, and a dose of anterograde cardioplegia was given with good arrest of the heart.  Next we exposed the lateral wall, but was unable to find a target suitable for grafting.  There was a high diagonal/ramus branch that was large, so we performed an anastomosis to it with vein graft.  Next, we exposed the anterior wall of the heart and identified a good target on D2.   An arteriotomy was created.  The vein was anastomosed in an end to side fashion.  Finally, we exposed a good target on the LAD, and fashioned an end to side anastomosis between it and the LITA.  We began to re-warm, and a re-animation dose of cardioplegia was given.  The heart was de-aired, and the cross clamp was removed.  Meticulous hemostasis was obtained.  A partial occludding clamp was then placed on the ascending aorta, and we created an end to side anastomosis between it and the proximal vein graft to D2.  The D1 vein graft was too small, so we jumped it off the hood of the other proximal vein graft.  The proximal site was marked with a ring.  Hemostasis was obtained, and we separated from cardiopulmonary bypass without event.the heparin was reversed with protamine.  Chest tubes and wires were placed, and the sternum was  re-approximated with with sternal wires.  The soft tissue and skin were re-approximated wth absorbable suture.    The patient tolerated the procedure without any immediate complications, and was transferred to the ICU in guarded condition.  Joseph Liu

## 2018-08-18 NOTE — Procedures (Signed)
Extubation Procedure Note  Patient Details:   Name: Joseph Liu DOB: 18-Apr-1950 MRN: [id number]   Airway Documentation:    Vent end date: 08/18/18 Vent end time: 1835   Evaluation  O2 sats: stable throughout Complications: No apparent complications Patient did tolerate procedure well. Bilateral Breath Sounds: Clear, Diminished   Yes   Patient was extubated to a 4L [place] without any complications, dyspnea or stridor noted. Patient was instructed on IS, highest goal reached was 585mL. NIF: -20, VC: 1L, positive cuff leak.  Claretta Fraise 08/18/2018, 6:35 PM

## 2018-08-18 NOTE — Progress Notes (Signed)
EVENING ROUNDS NOTE :     [address]       [place] [address]             [phone]                 Day of Surgery Procedure(s) (LRB): CORONARY ARTERY BYPASS GRAFTING (CABG) times three using left internal mammary artery and right saphenous vein. Aborted attempt left leg vein harvest. (N/A) TRANSESOPHAGEAL ECHOCARDIOGRAM (TEE) (N/A)   Total Length of Stay:  LOS: 2 days  Events:  On cardene and nitro .  Waking up slowly    BP 98/65   Pulse 86   Temp (!) 97.2 F (36.2 C)   Resp 12   Ht 5\' 8"  (1.727 m)   Wt 74.4 kg   SpO2 100%   BMI 24.94 kg/m   PAP: (20-27)/(10-16) 26/13 CO:  [2.8 L/min-3.4 L/min] 3.4 L/min CI:  [1.5 L/min/m2-1.8 L/min/m2] 1.8 L/min/m2  Vent Mode: SIMV;PRVC;PSV FiO2 (%):  [50 %] 50 % Set Rate:  [12 bmp] 12 bmp Vt Set:  [540 mL] 540 mL PEEP:  [5 cmH20] 5 cmH20 Pressure Support:  [10 cmH20] 10 cmH20 Plateau Pressure:  [14 cmH20-19 cmH20] 19 cmH20  . sodium chloride 20 mL/hr at 08/18/18 1500  . [START ON 08/19/2018] sodium chloride    . sodium chloride 10 mL/hr (08/18/18 1322)  . albumin human 12.5 g (08/18/18 1347)  . cefUROXime (ZINACEF)  IV 1.5 g (08/18/18 1609)  . dexmedetomidine (PRECEDEX) IV infusion 0.7 mcg/kg/hr (08/18/18 1322)  . famotidine (PEPCID) IV Stopped (08/18/18 1406)  . insulin 1.2 Units/hr (08/18/18 1322)  . lactated ringers    . lactated ringers    . lactated ringers    . magnesium sulfate 25 mL/hr at 08/18/18 1500  . niCARDipine 5 mg/hr (08/18/18 1527)  . nitroGLYCERIN 0 mcg/min (08/18/18 1322)  . phenylephrine (NEO-SYNEPHRINE) Adult infusion Stopped (08/18/18 1340)  . vancomycin      I/O last 3 completed shifts: In: 480 [P.O.:480] Out: -    CBC Latest Ref Rng & Units 08/18/2018 08/18/2018 08/18/2018  WBC 4.0 - 10.5 K/uL - - 6.9  Hemoglobin 13.0 - 17.0 g/dL 7.5(L) 7.5(L) 8.2(L)  Hematocrit 39.0 - 52.0 % 22.0(L) 22.0(L) 26.1(L)  Platelets 150 - 400 K/uL - - 175    BMP Latest Ref Rng & Units  08/18/2018 08/18/2018 08/18/2018  Glucose 70 - 99 mg/dL - 112(H) 119(H)  BUN 8 - 23 mg/dL - - -  Creatinine 0.61 - 1.24 mg/dL - - -  Sodium 135 - 145 mmol/L 140 138 137  Potassium 3.5 - 5.1 mmol/L 4.0 4.0 4.0  Chloride 98 - 111 mmol/L - - -  CO2 22 - 32 mmol/L - - -  Calcium 8.9 - 10.3 mg/dL - - -    ABG    Component Value Date/Time   PHART 7.430 08/18/2018 1401   PCO2ART 28.7 (L) 08/18/2018 1401   PO2ART 146.0 (H) 08/18/2018 1401   HCO3 19.3 (L) 08/18/2018 1401   TCO2 20 (L) 08/18/2018 1401   ACIDBASEDEF 5.0 (H) 08/18/2018 1401   O2SAT 99.0 08/18/2018 1401   POD 0 s/p CABG. Fast track extubation Minimal CT output    Melodie Bouillon, MD 08/18/2018 4:44 PM

## 2018-08-19 ENCOUNTER — Encounter (HOSPITAL_COMMUNITY): Payer: Self-pay | Admitting: Thoracic Surgery (Cardiothoracic Vascular Surgery)

## 2018-08-19 ENCOUNTER — Inpatient Hospital Stay (HOSPITAL_COMMUNITY): Payer: Medicare PPO

## 2018-08-19 LAB — GLUCOSE, CAPILLARY
Glucose-Capillary: 100 mg/dL — ABNORMAL HIGH (ref 70–99)
Glucose-Capillary: 102 mg/dL — ABNORMAL HIGH (ref 70–99)
Glucose-Capillary: 104 mg/dL — ABNORMAL HIGH (ref 70–99)
Glucose-Capillary: 104 mg/dL — ABNORMAL HIGH (ref 70–99)
Glucose-Capillary: 105 mg/dL — ABNORMAL HIGH (ref 70–99)
Glucose-Capillary: 109 mg/dL — ABNORMAL HIGH (ref 70–99)
Glucose-Capillary: 112 mg/dL — ABNORMAL HIGH (ref 70–99)
Glucose-Capillary: 112 mg/dL — ABNORMAL HIGH (ref 70–99)
Glucose-Capillary: 116 mg/dL — ABNORMAL HIGH (ref 70–99)
Glucose-Capillary: 136 mg/dL — ABNORMAL HIGH (ref 70–99)
Glucose-Capillary: 160 mg/dL — ABNORMAL HIGH (ref 70–99)
Glucose-Capillary: 92 mg/dL (ref 70–99)
Glucose-Capillary: 96 mg/dL (ref 70–99)
Glucose-Capillary: 98 mg/dL (ref 70–99)
Glucose-Capillary: 99 mg/dL (ref 70–99)

## 2018-08-19 LAB — BASIC METABOLIC PANEL
Anion gap: 8 (ref 5–15)
Anion gap: 10 (ref 5–15)
BUN: 11 mg/dL (ref 8–23)
BUN: 10 mg/dL (ref 8–23)
CO2: 22 mmol/L (ref 22–32)
CO2: 21 mmol/L — ABNORMAL LOW (ref 22–32)
Calcium: 8.5 mg/dL — ABNORMAL LOW (ref 8.9–10.3)
Calcium: 8.4 mg/dL — ABNORMAL LOW (ref 8.9–10.3)
Chloride: 106 mmol/L (ref 98–111)
Chloride: 103 mmol/L (ref 98–111)
Creatinine, Ser: 1.24 mg/dL (ref 0.61–1.24)
Creatinine, Ser: 1.32 mg/dL — ABNORMAL HIGH (ref 0.61–1.24)
GFR calc Af Amer: >60 mL/min (ref >60)
GFR calc Af Amer: >60 mL/min (ref >60)
GFR calc non Af Amer: >60 mL/min (ref >60)
GFR calc non Af Amer: 56 mL/min — ABNORMAL LOW (ref >60)
Glucose, Bld: 103 mg/dL — ABNORMAL HIGH (ref 70–99)
Glucose, Bld: 151 mg/dL — ABNORMAL HIGH (ref 70–99)
Potassium: 3.7 mmol/L (ref 3.5–5.1)
Potassium: 4.6 mmol/L (ref 3.5–5.1)
Sodium: 136 mmol/L (ref 135–145)
Sodium: 134 mmol/L — ABNORMAL LOW (ref 135–145)

## 2018-08-19 LAB — CBC
HCT: 28.9 % — ABNORMAL LOW (ref 39.0–52.0)
HCT: 29 % — ABNORMAL LOW (ref 39.0–52.0)
Hemoglobin: 8.9 g/dL — ABNORMAL LOW (ref 13.0–17.0)
Hemoglobin: 9 g/dL — ABNORMAL LOW (ref 13.0–17.0)
MCH: 26.6 pg (ref 26.0–34.0)
MCH: 27.3 pg (ref 26.0–34.0)
MCHC: 30.8 g/dL (ref 30.0–36.0)
MCHC: 31 g/dL (ref 30.0–36.0)
MCV: 86.3 fL (ref 80.0–100.0)
MCV: 87.9 fL (ref 80.0–100.0)
Platelets: 217 10*3/uL (ref 150–400)
Platelets: 214 10*3/uL (ref 150–400)
RBC: 3.35 MIL/uL — ABNORMAL LOW (ref 4.22–5.81)
RBC: 3.3 MIL/uL — ABNORMAL LOW (ref 4.22–5.81)
RDW: 15 % (ref 11.5–15.5)
RDW: 15.1 % (ref 11.5–15.5)
WBC: 7 10*3/uL (ref 4.0–10.5)
WBC: 7.5 10*3/uL (ref 4.0–10.5)
nRBC: 0 % (ref 0.0–0.2)
nRBC: 0 % (ref 0.0–0.2)

## 2018-08-19 LAB — ECHO INTRAOPERATIVE TEE
Height: 68 in
Weight: 2624.36 oz

## 2018-08-19 LAB — MAGNESIUM
Magnesium: 2.4 mg/dL (ref 1.7–2.4)
Magnesium: 2.1 mg/dL (ref 1.7–2.4)

## 2018-08-19 MED ORDER — INSULIN ASPART 100 UNIT/ML ~~LOC~~ SOLN
0.0000 [IU] | SUBCUTANEOUS | Status: DC
  Administered 2018-08-19: 21:00:00 2 [IU] via SUBCUTANEOUS
  Administered 2018-08-20: 4 [IU] via SUBCUTANEOUS
  Administered 2018-08-20 – 2018-08-21 (×7): 2 [IU] via SUBCUTANEOUS
  Administered 2018-08-21: 12:00:00 4 [IU] via SUBCUTANEOUS
  Administered 2018-08-21 – 2018-08-22 (×6): 2 [IU] via SUBCUTANEOUS
  Administered 2018-08-22 (×2): 4 [IU] via SUBCUTANEOUS
  Administered 2018-08-23: 8 [IU] via SUBCUTANEOUS
  Administered 2018-08-23: 08:00:00 4 [IU] via SUBCUTANEOUS
  Administered 2018-08-23: 05:00:00 2 [IU] via SUBCUTANEOUS

## 2018-08-19 MED ORDER — POTASSIUM CHLORIDE 10 MEQ/50ML IV SOLN
10.0000 meq | INTRAVENOUS | Status: AC
  Administered 2018-08-19 (×3): 10 meq via INTRAVENOUS
  Filled 2018-08-19: qty 50

## 2018-08-19 MED ORDER — CHLORHEXIDINE GLUCONATE CLOTH 2 % EX PADS: 6.0000 | MEDICATED_PAD | Freq: Every day | CUTANEOUS | Status: DC

## 2018-08-19 MED ORDER — CHLORHEXIDINE GLUCONATE CLOTH 2 % EX PADS
6.0000 | MEDICATED_PAD | Freq: Every day | CUTANEOUS | Status: DC
  Administered 2018-08-19 – 2018-08-20 (×2): 6 via TOPICAL

## 2018-08-19 MED ORDER — FUROSEMIDE 40 MG PO TABS
40.0000 mg | ORAL_TABLET | Freq: Every day | ORAL | Status: DC
  Administered 2018-08-19 – 2018-08-23 (×5): 40 mg via ORAL
  Filled 2018-08-19 (×2): qty 1
  Filled 2018-08-19: qty 2
  Filled 2018-08-19 (×2): qty 1

## 2018-08-19 MED ORDER — SODIUM CHLORIDE 0.9% FLUSH: 10.0000 mL | INTRAVENOUS | Status: DC | PRN

## 2018-08-19 MED ORDER — POTASSIUM CHLORIDE CRYS ER 20 MEQ PO TBCR
40.0000 meq | EXTENDED_RELEASE_TABLET | Freq: Every day | ORAL | Status: DC
  Administered 2018-08-19 – 2018-08-23 (×5): 40 meq via ORAL
  Filled 2018-08-19 (×5): qty 2

## 2018-08-19 MED ORDER — METOCLOPRAMIDE HCL 5 MG/ML IJ SOLN
10.0000 mg | Freq: Three times a day (TID) | INTRAMUSCULAR | Status: DC
  Administered 2018-08-19 – 2018-08-23 (×8): 10 mg via INTRAVENOUS
  Filled 2018-08-19 (×11): qty 2

## 2018-08-19 MED ORDER — METOPROLOL TARTRATE 25 MG PO TABS
25.0000 mg | ORAL_TABLET | Freq: Two times a day (BID) | ORAL | Status: DC
  Administered 2018-08-19 – 2018-08-21 (×5): 25 mg via ORAL
  Filled 2018-08-19: qty 1
  Filled 2018-08-19: qty 2
  Filled 2018-08-19 (×3): qty 1

## 2018-08-19 MED ORDER — POTASSIUM CHLORIDE 10 MEQ/50ML IV SOLN
INTRAVENOUS | Status: AC
  Administered 2018-08-19: 10 meq via INTRAVENOUS
  Filled 2018-08-19: qty 150

## 2018-08-19 MED ORDER — SODIUM CHLORIDE 0.9% FLUSH
10.0000 mL | Freq: Two times a day (BID) | INTRAVENOUS | Status: DC
  Administered 2018-08-19 – 2018-08-20 (×2): 10 mL

## 2018-08-19 MED FILL — Lidocaine HCl Local Preservative Free (PF) Inj 2%: INTRAMUSCULAR | Qty: 15 | Status: AC

## 2018-08-19 MED FILL — Heparin Sodium (Porcine) Inj 1000 Unit/ML: INTRAMUSCULAR | Qty: 30 | Status: AC

## 2018-08-19 MED FILL — Potassium Chloride Inj 2 mEq/ML: INTRAVENOUS | Qty: 40 | Status: AC

## 2018-08-19 NOTE — Anesthesia Postprocedure Evaluation (Signed)
Anesthesia Post Note  Patient: Joseph Liu  Procedure(s) Performed: CORONARY ARTERY BYPASS GRAFTING (CABG) times three using left internal mammary artery and right saphenous vein. Aborted attempt left leg vein harvest. (N/A Chest) TRANSESOPHAGEAL ECHOCARDIOGRAM (TEE) (N/A )     Patient location during evaluation: SICU Anesthesia Type: General Level of consciousness: patient remains intubated per anesthesia plan Pain management: pain level controlled Vital Signs Assessment: post-procedure vital signs reviewed and stable Respiratory status: patient remains intubated per anesthesia plan Cardiovascular status: stable Postop Assessment: no apparent nausea or vomiting Anesthetic complications: no    Last Vitals:  Vitals:   08/19/18 1200 08/19/18 1300  BP: 127/77 126/65  Pulse: 67 68  Resp: 19 17  Temp:    SpO2: 99% 98%    Last Pain:  Vitals:   08/19/18 1407  TempSrc:   PainSc: Asleep                 Zayd Bonet

## 2018-08-19 NOTE — Progress Notes (Signed)
[address]       [place] [address]             [phone]                 1 Day Post-Op Procedure(s) (LRB): CORONARY ARTERY BYPASS GRAFTING (CABG) times three using left internal mammary artery and right saphenous vein. Aborted attempt left leg vein harvest. (N/A) TRANSESOPHAGEAL ECHOCARDIOGRAM (TEE) (N/A)   Events: No events overnight _______________________________________________________________ Vitals: BP 134/70   Pulse 78   Temp (!) 97.5 F (36.4 C)   Resp 17   Ht 5\' 8"  (1.727 m)   Wt 79.9 kg   SpO2 96%   BMI 26.78 kg/m   - Neuro: alert NAD  - Cardiovascular: RRR, sinus PAP: (20-43)/(4-21) 33/10 CO:  [2.6 L/min-7.3 L/min] 4.8 L/min CI:  [1.4 L/min/m2-3.9 L/min/m2] 2.6 L/min/m2  - Pulm: EWOB, SS CT output  ABG    Component Value Date/Time   PHART 7.360 08/18/2018 1942   PCO2ART 35.5 08/18/2018 1942   PO2ART 128.0 (H) 08/18/2018 1942   HCO3 20.1 08/18/2018 1942   TCO2 21 (L) 08/18/2018 1942   ACIDBASEDEF 5.0 (H) 08/18/2018 1942   O2SAT 99.0 08/18/2018 1942    - Abd: soft - Extremity: trace edema  .Intake/Output      08/13 0701 - 08/14 0700 08/14 0701 - 08/15 0700   P.O.     I.V. (mL/kg) 3888.5 (48.7)    Blood 244    IV Piggyback 1352.2    Total Intake(mL/kg) 5484.7 (68.6)    Urine (mL/kg/hr) 3520 (1.8) 150 (0.7)   Emesis/NG output 0    Blood 570    Chest Tube 280    Total Output 4370 150   Net +1114.7 -150        Emesis Occurrence 1 x       _______________________________________________________________ Labs: CBC Latest Ref Rng & Units 08/19/2018 08/18/2018 08/18/2018  WBC 4.0 - 10.5 K/uL 7.0 - -  Hemoglobin 13.0 - 17.0 g/dL 8.9(L) 9.2(L) 10.5(L)  Hematocrit 39.0 - 52.0 % 28.9(L) 27.0(L) 31.0(L)  Platelets 150 - 400 K/uL 217 - -   CMP Latest Ref Rng & Units 08/19/2018 08/18/2018 08/18/2018  Glucose 70 - 99 mg/dL 103(H) - 185(H)  BUN 8 - 23 mg/dL 11 - 14  Creatinine 0.61 - 1.24 mg/dL 1.24 - 1.20  Sodium 135 - 145  mmol/L 136 139 138  Potassium 3.5 - 5.1 mmol/L 3.7 3.9 4.0  Chloride 98 - 111 mmol/L 106 - 123(H)  CO2 22 - 32 mmol/L 22 - -  Calcium 8.9 - 10.3 mg/dL 8.5(L) - -  Total Protein 6.5 - 8.1 g/dL - - -  Total Bilirubin 0.3 - 1.2 mg/dL - - -  Alkaline Phos 38 - 126 U/L - - -  AST 15 - 41 U/L - - -  ALT 0 - 44 U/L - - -    CXR: stable  _______________________________________________________________  Assessment and Plan: POD 1 s/p CABG 3.  Doing well  Neuro: pain controlled CV: on asp statin and BB.  Still on cardene, but will increase BB and wean gtt.  Aline and swan out.  Will remove chest tube Pulm: pulm toilet Renal: creat stable.  Good uop.  Will start diuresis today GI: on diet Heme: stable ID: [email or web address] Endo: switching to SSI.  Will need DM education given HbA1c of 9.5 Dispo: floor today once off cardene  Melodie Bouillon, MD 08/19/2018 9:46 AM

## 2018-08-20 ENCOUNTER — Inpatient Hospital Stay (HOSPITAL_COMMUNITY): Payer: Medicare PPO

## 2018-08-20 LAB — CBC
HCT: 29.7 % — ABNORMAL LOW (ref 39.0–52.0)
Hemoglobin: 9.1 g/dL — ABNORMAL LOW (ref 13.0–17.0)
MCH: 27.2 pg (ref 26.0–34.0)
MCHC: 30.6 g/dL (ref 30.0–36.0)
MCV: 88.7 fL (ref 80.0–100.0)
Platelets: 227 10*3/uL (ref 150–400)
RBC: 3.35 MIL/uL — ABNORMAL LOW (ref 4.22–5.81)
RDW: 15.2 % (ref 11.5–15.5)
WBC: 6.8 10*3/uL (ref 4.0–10.5)
nRBC: 0 % (ref 0.0–0.2)

## 2018-08-20 LAB — BASIC METABOLIC PANEL
Anion gap: 8 (ref 5–15)
BUN: 17 mg/dL (ref 8–23)
CO2: 22 mmol/L (ref 22–32)
Calcium: 8.6 mg/dL — ABNORMAL LOW (ref 8.9–10.3)
Chloride: 103 mmol/L (ref 98–111)
Creatinine, Ser: 1.65 mg/dL — ABNORMAL HIGH (ref 0.61–1.24)
GFR calc Af Amer: 49 mL/min — ABNORMAL LOW (ref >60)
GFR calc non Af Amer: 43 mL/min — ABNORMAL LOW (ref >60)
Glucose, Bld: 146 mg/dL — ABNORMAL HIGH (ref 70–99)
Potassium: 4.7 mmol/L (ref 3.5–5.1)
Sodium: 133 mmol/L — ABNORMAL LOW (ref 135–145)

## 2018-08-20 LAB — GLUCOSE, CAPILLARY
Glucose-Capillary: 129 mg/dL — ABNORMAL HIGH (ref 70–99)
Glucose-Capillary: 129 mg/dL — ABNORMAL HIGH (ref 70–99)
Glucose-Capillary: 134 mg/dL — ABNORMAL HIGH (ref 70–99)
Glucose-Capillary: 143 mg/dL — ABNORMAL HIGH (ref 70–99)
Glucose-Capillary: 145 mg/dL — ABNORMAL HIGH (ref 70–99)
Glucose-Capillary: 154 mg/dL — ABNORMAL HIGH (ref 70–99)
Glucose-Capillary: 178 mg/dL — ABNORMAL HIGH (ref 70–99)

## 2018-08-20 NOTE — Progress Notes (Deleted)
Wife into visit patient, he knows he s in hospital, knows it is 2020 and who president is, but  Is picking at lines and tubes, taking off oxygen,  Not making sense in things he says.  Wife states  he was saying things like this at home as well.  He has a sore on his right cheek that has been there according to wife it keep opening up,  Will get it assessed from dermatologist. He has had  history of skin cancers previously

## 2018-08-20 NOTE — Progress Notes (Signed)
2 Days Post-Op Procedure(s) (LRB): CORONARY ARTERY BYPASS GRAFTING (CABG) times three using left internal mammary artery and right saphenous vein. Aborted attempt left leg vein harvest. (N/A) TRANSESOPHAGEAL ECHOCARDIOGRAM (TEE) (N/A) Subjective: No complaints  Objective: Vital signs in last 24 hours: Temp:  [97.2 F (36.2 C)-98 F (36.7 C)] 97.8 F (36.6 C) (08/15 0452) Pulse Rate:  [66-81] 75 (08/15 0600) Cardiac Rhythm: Normal sinus rhythm (08/15 0400) Resp:  [11-26] 14 (08/15 0600) BP: (99-149)/(65-79) 128/72 (08/15 0600) SpO2:  [92 %-100 %] 93 % (08/15 0600) Arterial Line BP: (166-185)/(53-56) 185/56 (08/14 0900)  Hemodynamic parameters for last 24 hours: PAP: (26-32)/(2-5) 32/5 CO:  [5.7 L/min] 5.7 L/min CI:  [3 L/min/m2] 3 L/min/m2  Intake/Output from previous day: 08/14 0701 - 08/15 0700 In: 923.1 [P.O.:390; I.V.:325; IV Piggyback:208.2] Out: 1090 [Urine:1000; Chest Tube:90] Intake/Output this shift: No intake/output data recorded.  General appearance: alert and cooperative Neurologic: intact Heart: regular rate and rhythm, S1, S2 normal, no murmur, click, rub or gallop Lungs: clear to auscultation bilaterally Wound: c/d/i  Lab Results: Recent Labs    08/19/18 1627 08/20/18 0501  WBC 7.5 6.8  HGB 9.0* 9.1*  HCT 29.0* 29.7*  PLT 214 227   BMET:  Recent Labs    08/19/18 1627 08/20/18 0501  NA 134* 133*  K 4.6 4.7  CL 103 103  CO2 21* 22  GLUCOSE 151* 146*  BUN 10 17  CREATININE 1.32* 1.65*  CALCIUM 8.4* 8.6*    PT/INR:  Recent Labs    08/18/18 1327  LABPROT 16.1*  INR 1.3*   ABG    Component Value Date/Time   PHART 7.360 08/18/2018 1942   HCO3 20.1 08/18/2018 1942   TCO2 21 (L) 08/18/2018 1942   ACIDBASEDEF 5.0 (H) 08/18/2018 1942   O2SAT 99.0 08/18/2018 1942   CBG (last 3)  Recent Labs    08/19/18 2050 08/20/18 0023 08/20/18 0449  GLUCAP 160* 134* 143*    Assessment/Plan: S/P Procedure(s) (LRB): CORONARY ARTERY BYPASS  GRAFTING (CABG) times three using left internal mammary artery and right saphenous vein. Aborted attempt left leg vein harvest. (N/A) TRANSESOPHAGEAL ECHOCARDIOGRAM (TEE) (N/A) Mobilize Diuresis Diabetes control Plan for transfer to step-down: see transfer orders   LOS: 4 days    Joseph Liu 08/20/2018

## 2018-08-21 ENCOUNTER — Encounter (HOSPITAL_COMMUNITY): Payer: Self-pay

## 2018-08-21 ENCOUNTER — Inpatient Hospital Stay (HOSPITAL_COMMUNITY): Payer: Medicare PPO

## 2018-08-21 LAB — BASIC METABOLIC PANEL
Anion gap: 14 (ref 5–15)
BUN: 19 mg/dL (ref 8–23)
CO2: 20 mmol/L — ABNORMAL LOW (ref 22–32)
Calcium: 9.1 mg/dL (ref 8.9–10.3)
Chloride: 101 mmol/L (ref 98–111)
Creatinine, Ser: 1.57 mg/dL — ABNORMAL HIGH (ref 0.61–1.24)
GFR calc Af Amer: 52 mL/min — ABNORMAL LOW (ref >60)
GFR calc non Af Amer: 45 mL/min — ABNORMAL LOW (ref >60)
Glucose, Bld: 146 mg/dL — ABNORMAL HIGH (ref 70–99)
Potassium: 4.5 mmol/L (ref 3.5–5.1)
Sodium: 135 mmol/L (ref 135–145)

## 2018-08-21 LAB — TYPE AND SCREEN
ABO/RH(D): B POS
Antibody Screen: NEGATIVE
Unit division: 0
Unit division: 0

## 2018-08-21 LAB — BPAM RBC
Blood Product Expiration Date: 202008182359
Blood Product Expiration Date: 202008232359
ISSUE DATE / TIME: 202008152317
Unit Type and Rh: 7300
Unit Type and Rh: 7300

## 2018-08-21 LAB — GLUCOSE, CAPILLARY
Glucose-Capillary: 97 mg/dL (ref 70–99)
Glucose-Capillary: 157 mg/dL — ABNORMAL HIGH (ref 70–99)
Glucose-Capillary: 163 mg/dL — ABNORMAL HIGH (ref 70–99)
Glucose-Capillary: 138 mg/dL — ABNORMAL HIGH (ref 70–99)
Glucose-Capillary: 154 mg/dL — ABNORMAL HIGH (ref 70–99)

## 2018-08-21 MED ORDER — CHLORHEXIDINE GLUCONATE CLOTH 2 % EX PADS
6.0000 | MEDICATED_PAD | Freq: Every day | CUTANEOUS | Status: DC
  Administered 2018-08-21: 19:00:00 6 via TOPICAL

## 2018-08-21 MED ORDER — MEGESTROL ACETATE 40 MG PO TABS
40.0000 mg | ORAL_TABLET | Freq: Every day | ORAL | Status: DC
  Administered 2018-08-21: 14:00:00 40 mg via ORAL
  Filled 2018-08-21 (×2): qty 1

## 2018-08-21 MED ORDER — LISINOPRIL 5 MG PO TABS
5.0000 mg | ORAL_TABLET | Freq: Every day | ORAL | Status: DC
  Administered 2018-08-21 – 2018-08-23 (×3): 5 mg via ORAL
  Filled 2018-08-21 (×3): qty 1

## 2018-08-21 NOTE — Plan of Care (Signed)
Progressing well, except having trouble with eating, loss of apatite.started megace. Patient does not feel hungry. Pain controlled with periodic tramadol and routine tylenol. Patient is feeling tired, reticent to get out of bed and walk.  Discussed positives of pushing self to walk, aeration, decreased risks of blood clots etc.   Potential for impaired healing  r/t loss of apatite.- encourage small meals, increased protein, calorie count dietitian consult. Started megace.   Activity intolerance r/t tiredness encourage short periods of exercise more times a day.  Get family involved in encouraging patient to ambulate

## 2018-08-21 NOTE — Progress Notes (Addendum)
3 Days Post-Op Procedure(s) (LRB): CORONARY ARTERY BYPASS GRAFTING (CABG) times three using left internal mammary artery and right saphenous vein. Aborted attempt left leg vein harvest. (N/A) TRANSESOPHAGEAL ECHOCARDIOGRAM (TEE) (N/A) Subjective: No complaints  Objective: Vital signs in last 24 hours: Temp:  [97.6 F (36.4 C)-99 F (37.2 C)] 99 F (37.2 C) (08/16 0700) Pulse Rate:  [80-89] 87 (08/15 1600) Cardiac Rhythm: Normal sinus rhythm (08/16 0400) Resp:  [15-25] 20 (08/16 0600) BP: (116-165)/(69-92) 152/89 (08/16 0600) SpO2:  [91 %-97 %] 97 % (08/16 0400) Weight:  [77.5 kg] 77.5 kg (08/16 0500)  Hemodynamic parameters for last 24 hours:    Intake/Output from previous day: 08/15 0701 - 08/16 0700 In: 10 [I.V.:10] Out: 3375 [Urine:3075; Stool:300] Intake/Output this shift: No intake/output data recorded.  General appearance: alert and cooperative Neurologic: intact Heart: regular rate and rhythm, S1, S2 normal, no murmur, click, rub or gallop Lungs: clear to auscultation bilaterally Abdomen: soft, non-tender; bowel sounds normal; no masses,  no organomegaly Extremities: extremities normal, atraumatic, no cyanosis or edema Wound: c/d/i  Lab Results: Recent Labs    08/19/18 1627 08/20/18 0501  WBC 7.5 6.8  HGB 9.0* 9.1*  HCT 29.0* 29.7*  PLT 214 227   BMET:  Recent Labs    08/19/18 1627 08/20/18 0501  NA 134* 133*  K 4.6 4.7  CL 103 103  CO2 21* 22  GLUCOSE 151* 146*  BUN 10 17  CREATININE 1.32* 1.65*  CALCIUM 8.4* 8.6*    PT/INR:  Recent Labs    08/18/18 1327  LABPROT 16.1*  INR 1.3*   ABG    Component Value Date/Time   PHART 7.360 08/18/2018 1942   HCO3 20.1 08/18/2018 1942   TCO2 21 (L) 08/18/2018 1942   ACIDBASEDEF 5.0 (H) 08/18/2018 1942   O2SAT 99.0 08/18/2018 1942   CBG (last 3)  Recent Labs    08/20/18 2321 08/21/18 0309 08/21/18 0744  GLUCAP 129* 97 157*    Assessment/Plan: S/P Procedure(s) (LRB): CORONARY ARTERY  BYPASS GRAFTING (CABG) times three using left internal mammary artery and right saphenous vein. Aborted attempt left leg vein harvest. (N/A) TRANSESOPHAGEAL ECHOCARDIOGRAM (TEE) (N/A) Mobilize Plan for transfer to step-down: see transfer orders  F/u CXR and Bmp; begin ACE-I due to elevated BP   LOS: 5 days    Wonda Olds 08/21/2018   2-view CXR with clear lung fields except at the sulci

## 2018-08-22 LAB — GLUCOSE, CAPILLARY
Glucose-Capillary: 125 mg/dL — ABNORMAL HIGH (ref 70–99)
Glucose-Capillary: 127 mg/dL — ABNORMAL HIGH (ref 70–99)
Glucose-Capillary: 131 mg/dL — ABNORMAL HIGH (ref 70–99)
Glucose-Capillary: 144 mg/dL — ABNORMAL HIGH (ref 70–99)
Glucose-Capillary: 155 mg/dL — ABNORMAL HIGH (ref 70–99)
Glucose-Capillary: 163 mg/dL — ABNORMAL HIGH (ref 70–99)
Glucose-Capillary: 192 mg/dL — ABNORMAL HIGH (ref 70–99)

## 2018-08-22 LAB — BASIC METABOLIC PANEL
Anion gap: 9 (ref 5–15)
BUN: 19 mg/dL (ref 8–23)
CO2: 24 mmol/L (ref 22–32)
Calcium: 8.9 mg/dL (ref 8.9–10.3)
Chloride: 104 mmol/L (ref 98–111)
Creatinine, Ser: 1.44 mg/dL — ABNORMAL HIGH (ref 0.61–1.24)
GFR calc Af Amer: 58 mL/min — ABNORMAL LOW (ref >60)
GFR calc non Af Amer: 50 mL/min — ABNORMAL LOW (ref >60)
Glucose, Bld: 146 mg/dL — ABNORMAL HIGH (ref 70–99)
Potassium: 4.2 mmol/L (ref 3.5–5.1)
Sodium: 137 mmol/L (ref 135–145)

## 2018-08-22 MED ORDER — METOPROLOL TARTRATE 50 MG PO TABS
50.0000 mg | ORAL_TABLET | Freq: Two times a day (BID) | ORAL | Status: DC
  Administered 2018-08-22 – 2018-08-23 (×3): 50 mg via ORAL
  Filled 2018-08-22 (×3): qty 1

## 2018-08-22 MED ORDER — LISINOPRIL 5 MG PO TABS: 5.0000 mg | ORAL_TABLET | Freq: Every day | ORAL | 1 refills | Status: DC

## 2018-08-22 MED ORDER — ATORVASTATIN CALCIUM 40 MG PO TABS: 40.0000 mg | ORAL_TABLET | Freq: Every day | ORAL | 1 refills | Status: DC

## 2018-08-22 MED ORDER — ASPIRIN 325 MG PO TBEC: 325.0000 mg | DELAYED_RELEASE_TABLET | Freq: Every day | ORAL | 0 refills | Status: AC

## 2018-08-22 MED ORDER — METOPROLOL TARTRATE 25 MG PO TABS: 25.0000 mg | ORAL_TABLET | Freq: Two times a day (BID) | ORAL | 1 refills | Status: DC

## 2018-08-22 MED ORDER — DRONABINOL 2.5 MG PO CAPS
2.5000 mg | ORAL_CAPSULE | Freq: Two times a day (BID) | ORAL | Status: DC
  Administered 2018-08-22 – 2018-08-23 (×3): 2.5 mg via ORAL
  Filled 2018-08-22 (×3): qty 1

## 2018-08-22 MED ORDER — TRAMADOL HCL 50 MG PO TABS: 50.0000 mg | ORAL_TABLET | ORAL | 0 refills | Status: AC | PRN

## 2018-08-22 MED ORDER — FUROSEMIDE 40 MG PO TABS: 40.0000 mg | ORAL_TABLET | Freq: Every day | ORAL | 0 refills | Status: DC

## 2018-08-22 MED ORDER — POTASSIUM CHLORIDE ER 20 MEQ PO TBCR: 20.0000 meq | EXTENDED_RELEASE_TABLET | Freq: Every day | ORAL | 0 refills | Status: DC

## 2018-08-22 MED FILL — Heparin Sodium (Porcine) Inj 1000 Unit/ML: INTRAMUSCULAR | Qty: 10 | Status: AC

## 2018-08-22 MED FILL — Electrolyte-R (PH 7.4) Solution: INTRAVENOUS | Qty: 4000 | Status: AC

## 2018-08-22 MED FILL — Sodium Chloride IV Soln 0.9%: INTRAVENOUS | Qty: 2000 | Status: AC

## 2018-08-22 MED FILL — Calcium Chloride Inj 10%: INTRAVENOUS | Qty: 10 | Status: AC

## 2018-08-22 MED FILL — Mannitol IV Soln 20%: INTRAVENOUS | Qty: 500 | Status: AC

## 2018-08-22 MED FILL — Sodium Bicarbonate IV Soln 8.4%: INTRAVENOUS | Qty: 50 | Status: AC

## 2018-08-22 NOTE — Progress Notes (Addendum)
CARDIAC REHAB PHASE I   PRE:  Rate/Rhythm: 101 ST  BP:  Supine: 131/89  Sitting:   Standing:    SaO2: 97%RA  MODE:  Ambulation: 370 ft   POST:  Rate/Rhythm: 138 afib,  108 ST after lying down  BP:  Supine: 168/95, 185/82, 174/87  Sitting:   Standing:    SaO2: 98%RA 0910-1000 Pt had only walked with EVA and he has regular rolling walker at home. Pt walked 370 ft on RA with rolling walker stopping several times to catch his breath. Encouraged pursed lip breathing. When returning to room, notified that pt's HR at 130-140s.  Assisted pt to bed and BP taken. When rhythm assessed he was in [place]. Pt stated he did feel his heart racing a little during walk. Education completed with pt who voiced understanding. Reviewed IS use, staying in the tube, sternal precautions, walking for ex and carb counting and heart healthy food choices. Pt stated he would like to watch discharge video later. RN aware. CV PA in to assess pt. Discussed CRP 2. Will send letter of interest to [facility].   Graylon Good, RN BSN  08/22/2018 9:52 AM

## 2018-08-22 NOTE — Progress Notes (Signed)
     [address]       [place] [address]             [phone]       Doing well ready to go home  Today's Vitals   08/22/18 0000 08/22/18 0337 08/22/18 0400 08/22/18 0500  BP: (!) 148/85  (!) 107/57   Pulse:      Resp: 18  14   Temp:  98.7 F (37.1 C)    TempSrc:  Oral    SpO2: 97%  96%   Weight:    76 kg  Height:      PainSc: 0-No pain  0-No pain    Body mass index is 25.48 kg/m.  Alert NAD Sinus Dressings clean EWOB abd ND  BMP Latest Ref Rng & Units 08/22/2018 08/21/2018 08/20/2018  Glucose 70 - 99 mg/dL 146(H) 146(H) 146(H)  BUN 8 - 23 mg/dL 19 19 17   Creatinine 0.61 - 1.24 mg/dL 1.44(H) 1.57(H) 1.65(H)  Sodium 135 - 145 mmol/L 137 135 133(L)  Potassium 3.5 - 5.1 mmol/L 4.2 4.5 4.7  Chloride 98 - 111 mmol/L 104 101 103  CO2 22 - 32 mmol/L 24 20(L) 22  Calcium 8.9 - 10.3 mg/dL 8.9 9.1 8.6(L)   CBC Latest Ref Rng & Units 08/20/2018 08/19/2018 08/19/2018  WBC 4.0 - 10.5 K/uL 6.8 7.5 7.0  Hemoglobin 13.0 - 17.0 g/dL 9.1(L) 9.0(L) 8.9(L)  Hematocrit 39.0 - 52.0 % 29.7(L) 29.0(L) 28.9(L)  Platelets 150 - 400 K/uL 227 214 217     POD 4 s/p CABG Doing well Ready for floor/home Slightly tachy this am, but HR and BP have been stable on 25 of metop Needs to have a bowel movement Dispo: home soon.

## 2018-08-22 NOTE — Progress Notes (Signed)
Nurse paged me that patient while walking, went into a fib with RVR. After he was put back in bed, he was ST and then SR in the 90's when I examined him. I have increased his Lopressor to 50 mg bid (he was on 75 mg bid prior to surgery). As discussed with Dr. Kipp Brood, will not discharge home, and will continue to monitor. If further a fib, will start Amiodarone.

## 2018-08-22 NOTE — Progress Notes (Signed)
EVENING ROUNDS NOTE :     [address]       [place] [address]             [phone]                 4 Days Post-Op Procedure(s) (LRB): CORONARY ARTERY BYPASS GRAFTING (CABG) times three using left internal mammary artery and right saphenous vein. Aborted attempt left leg vein harvest. (N/A) TRANSESOPHAGEAL ECHOCARDIOGRAM (TEE) (N/A)   Total Length of Stay:  LOS: 6 days  Events:  Short run of afib this am    BP 128/84   Pulse (!) 108   Temp 99 F (37.2 C) (Oral)   Resp 14   Ht 5\' 8"  (1.727 m)   Wt 76 kg   SpO2 95%   BMI 25.48 kg/m           I/O last 3 completed shifts: In: 120 [P.O.:120] Out: 2350 [Urine:2350]   CBC Latest Ref Rng & Units 08/20/2018 08/19/2018 08/19/2018  WBC 4.0 - 10.5 K/uL 6.8 7.5 7.0  Hemoglobin 13.0 - 17.0 g/dL 9.1(L) 9.0(L) 8.9(L)  Hematocrit 39.0 - 52.0 % 29.7(L) 29.0(L) 28.9(L)  Platelets 150 - 400 K/uL 227 214 217    BMP Latest Ref Rng & Units 08/22/2018 08/21/2018 08/20/2018  Glucose 70 - 99 mg/dL 146(H) 146(H) 146(H)  BUN 8 - 23 mg/dL 19 19 17   Creatinine 0.61 - 1.24 mg/dL 1.44(H) 1.57(H) 1.65(H)  Sodium 135 - 145 mmol/L 137 135 133(L)  Potassium 3.5 - 5.1 mmol/L 4.2 4.5 4.7  Chloride 98 - 111 mmol/L 104 101 103  CO2 22 - 32 mmol/L 24 20(L) 22  Calcium 8.9 - 10.3 mg/dL 8.9 9.1 8.6(L)    ABG    Component Value Date/Time   PHART 7.360 08/18/2018 1942   PCO2ART 35.5 08/18/2018 1942   PO2ART 128.0 (H) 08/18/2018 1942   HCO3 20.1 08/18/2018 1942   TCO2 21 (L) 08/18/2018 1942   ACIDBASEDEF 5.0 (H) 08/18/2018 1942   O2SAT 99.0 08/18/2018 1942    Home tomorrow if no more afib   Melodie Bouillon, MD 08/22/2018 4:15 PM

## 2018-08-23 LAB — GLUCOSE, CAPILLARY
Glucose-Capillary: 121 mg/dL — ABNORMAL HIGH (ref 70–99)
Glucose-Capillary: 200 mg/dL — ABNORMAL HIGH (ref 70–99)
Glucose-Capillary: 214 mg/dL — ABNORMAL HIGH (ref 70–99)

## 2018-08-23 MED ORDER — METOPROLOL TARTRATE 50 MG PO TABS: 50.0000 mg | ORAL_TABLET | Freq: Two times a day (BID) | ORAL | 1 refills | Status: DC

## 2018-08-23 NOTE — Progress Notes (Signed)
Spoke with pt to make sure he did not have any questions regarding education. He is in good understanding but sts he is interested in doing CRPII here in [place]. Will refer to [place] and [place] to give him the options. Gave brochure and put in referral.  [phone] Yves Dill CES, ACSM 10:44 AM 08/23/2018

## 2018-08-23 NOTE — Progress Notes (Signed)
Inpatient Diabetes Program Recommendations  AACE/ADA: New Consensus Statement on Inpatient Glycemic Control   Target Ranges:  Prepandial:   less than 140 mg/dL      Peak postprandial:   less than 180 mg/dL (1-2 hours)      Critically ill patients:  140 - 180 mg/dL  Results for Joseph Liu, Joseph Liu (MRN [id number]) as of 08/23/2018 10:46  Ref. Range 08/22/2018 04:06 08/22/2018 09:52 08/22/2018 11:44 08/22/2018 15:32 08/22/2018 21:31 08/22/2018 23:30 08/23/2018 03:56 08/23/2018 08:09 08/23/2018 11:01  Glucose-Capillary Latest Ref Range: 70 - 99 mg/dL 131 (H) 163 (H) 144 (H) 127 (H) 192 (H) 155 (H) 121 (H) 200 (H) 214 (H)   Results for Joseph Liu, Joseph Liu (MRN [id number]) as of 08/23/2018 10:46  Ref. Range 08/17/2018 00:16  Hemoglobin A1C Latest Ref Range: 4.8 - 5.6 % 9.5 (H)   Review of Glycemic Control  Diabetes history: DM2 Outpatient Diabetes medications: Amaryl 4 mg QAM, Glipizide 10 mg BID, Novolog 70/30 FlexPen 25 units BID Current orders for Inpatient glycemic control: Novolog 0-24 units Q4H  NOTE: Noted consult for diabetes coordinator. Chart reviewed. Spoke with patient over the phone about diabetes and home regimen for diabetes control. Patient reports being followed by PCP and Endocrinologist in [place] for diabetes management. Patient reports that he would like to get a new PCP and Endocrinologist in the [place] area and he reports that he has been given some names of providers in the [place] area.  Patient states that he is prescribed Novolog 70/30 Flexpen 25 units BID, Glipizide 10 mg BID, an Amaryl 4 mg QAM an outpatient for diabetes control. Patient admits that recently he has skipped some of his DM medications and not taken them consistently as prescribed. Patient reports checking glucose 2-3 times per day and that it is usually 80-180's mg/dl in the morning and goes up in the 200-300's mg/dl during the day after eating.  Discussed A1C results (9.5% on 08/17/18 ) and explained that current A1C  indicates an average glucose of 226 mg/dl over the past 2-3 months. Discussed glucose and A1C goals. Discussed importance of checking CBGs and maintaining good CBG control to prevent long-term and short-term complications. Stressed importance of tight glycemic control following CABG to decrease risk of complications.  Explained how hyperglycemia leads to damage within blood vessels which lead to the common complications seen with uncontrolled diabetes. Patient states he uses insulin pens for insulin injections and usually gives himself insulin in the same area of his abdomen.  Discussed insulin storage to ensure insulin is being stored correctly. Also discussed insulin injection sites and importance of rotating sites being used for insulin administration.  Encouraged patient to check glucose 4 times per day (before meals and at bedtime) and to reach out to his current PCP or Endocrinologist if glucose is staying consistently elevated (over 180 mg/dl).  Encouraged patient to take DM medications as directed on discharge paperwork and reach out to his provider if needed to get DM better controlled. Informed patient that consult for outpatient DM eduction has been ordered and he will be contacted by [place] regarding outpatient education.   Patient verbalized understanding of information discussed and reports no further questions at this time related to diabetes.  Thanks, Barnie Alderman, RN, MSN, CDE Diabetes Coordinator Inpatient Diabetes Program [phone] (Team Pager)

## 2018-08-23 NOTE — Progress Notes (Addendum)
[facility] DAILY ICU PROGRESS NOTE                   [address]            [place] [id number]          [id number]   5 Days Post-Op Procedure(s) (LRB): CORONARY ARTERY BYPASS GRAFTING (CABG) times three using left internal mammary artery and right saphenous vein. Aborted attempt left leg vein harvest. (N/A) TRANSESOPHAGEAL ECHOCARDIOGRAM (TEE) (N/A)  Total Length of Stay:  LOS: 7 days   Subjective: Patient sitting in chair without complaints this am. He hopes to go home.  Objective: Vital signs in last 24 hours: Temp:  [99 F (37.2 C)-99.3 F (37.4 C)] 99.1 F (37.3 C) (08/18 0400) Pulse Rate:  [108] 108 (08/17 1008) Cardiac Rhythm: Normal sinus rhythm (08/17 2000) Resp:  [14-23] 20 (08/18 0400) BP: (115-174)/(63-90) 142/77 (08/18 0400) SpO2:  [95 %-98 %] 96 % (08/18 0400) Weight:  [73.5 kg] 73.5 kg (08/18 0600)  Filed Weights   08/21/18 0500 08/22/18 0500 08/23/18 0600  Weight: 77.5 kg 76 kg 73.5 kg    Weight change: -2.5 kg       Intake/Output from previous day: 08/17 0701 - 08/18 0700 In: 840 [P.O.:840] Out: 727 [Urine:725; Stool:2]  Intake/Output this shift: No intake/output data recorded.  Current Meds: Scheduled Meds: . acetaminophen  1,000 mg Oral Q6H   Or  . acetaminophen (TYLENOL) oral liquid 160 mg/5 mL  1,000 mg Per Tube Q6H  . allopurinol  100 mg Oral Daily  . aspirin EC  325 mg Oral Daily   Or  . aspirin  324 mg Per Tube Daily  . atorvastatin  40 mg Oral q1800  . bisacodyl  10 mg Oral Daily   Or  . bisacodyl  10 mg Rectal Daily  . Chlorhexidine Gluconate Cloth  6 each Topical Q0600  . docusate sodium  200 mg Oral Daily  . dronabinol  2.5 mg Oral BID AC  . furosemide  40 mg Oral Daily  . insulin aspart  0-24 Units Subcutaneous Q4H  . levothyroxine  100 mcg Oral QAC breakfast  . lisinopril  5 mg Oral Daily  . mouth rinse  15 mL Mouth Rinse BID  . metoCLOPramide (REGLAN) injection  10 mg Intravenous Q8H  . metoprolol  tartrate  50 mg Oral BID  . pantoprazole  40 mg Oral Daily  . potassium chloride  40 mEq Oral Daily   Continuous Infusions: PRN Meds:.metoprolol tartrate, ondansetron (ZOFRAN) IV, traMADol  General appearance: alert, cooperative and no distress Heart: Slightly tachycardic Lungs: clear to auscultation bilaterally Abdomen: Soft, non tender, bowel sounds present Extremities: Trace LE edema Wounds: Cleans and dry  Lab Results: CBC:No results for input(s): WBC, HGB, HCT, PLT in the last 72 hours. BMET:  Recent Labs    08/21/18 1150 08/22/18 0242  NA 135 137  K 4.5 4.2  CL 101 104  CO2 20* 24  GLUCOSE 146* 146*  BUN 19 19  CREATININE 1.57* 1.44*  CALCIUM 9.1 8.9    CMET: Lab Results  Component Value Date   WBC 6.8 08/20/2018   HGB 9.1 (L) 08/20/2018   HCT 29.7 (L) 08/20/2018   PLT 227 08/20/2018   GLUCOSE 146 (H) 08/22/2018   ALT 29 08/17/2018   AST 28 08/17/2018   NA 137 08/22/2018   K 4.2 08/22/2018   CL 104 08/22/2018   CREATININE 1.44 (H) 08/22/2018   BUN 19  08/22/2018   CO2 24 08/22/2018   INR 1.3 (H) 08/18/2018   HGBA1C 9.5 (H) 08/17/2018      PT/INR: No results for input(s): LABPROT, INR in the last 72 hours. Radiology: No results found.   Assessment/Plan: S/P Procedure(s) (LRB): CORONARY ARTERY BYPASS GRAFTING (CABG) times three using left internal mammary artery and right saphenous vein. Aborted attempt left leg vein harvest. (N/A) TRANSESOPHAGEAL ECHOCARDIOGRAM (TEE) (N/A)   1. CV-He went into a fib with RVR yesterday mid morning. Converted to ST then SR.  Slightly tachycardic with HR in the low 100's this am. On Lopressor 50 mg bid and Lisinopril 5 mg daily. As discussed with Dr. Kipp Brood, will continue on these medications and he will be seen on Friday in his office. 2. Pulmonary-on room air. Encourage incentive spirometer 3. Mild volume overload-may be at or just below pre op weight. On Lasix 40 mg daily and will continue as discussed with Dr.  Kipp Brood. 4.DM-CBGs 192/155/121. On Insulin. He was on Glimepiride 4 mg daily with breakfast and Glipizide 10 mg bid pre op . I discussed with pharmacist. Will continue Glipizdie 10 mg bid and Insulin. I discussed with patient importance of following up with a medical doctor for further diabetes management and the importance of strict diabetes control. He wants to see a physician in [place] instead of [place]. I told him he would have to find a priamry care doctor but cardiology and follow up with Dr. Kipp Brood have been arranged. 5. As discussed with  Dr. Kipp Brood, discharge today    Nani Skillern PA-C 08/23/2018 7:44 AM

## 2018-08-23 NOTE — Progress Notes (Signed)
Patient given discharge instructions and verbalizes understanding. Patient is awaiting ride and will take to car via [place]. Patient with no complaints at the current time.

## 2018-08-24 ENCOUNTER — Telehealth (HOSPITAL_COMMUNITY): Payer: Self-pay

## 2018-08-24 NOTE — Telephone Encounter (Signed)
Attempted to call patient in regards to Cardiac Rehab - LM on VM 

## 2018-08-24 NOTE — Telephone Encounter (Signed)
Pt insurance is active and benefits verified through [facility]. Co-pay $10.00, DED $0.00/$0.00 met, out of pocket $6,700.00/$90.64 met, co-insurance 0%. No pre-authorization required. Passport, 08/24/2018 @ 4:09PM, AVW#[id number]  Will contact patient to see if he is interested in the Cardiac Rehab Program. If interested, patient will need to complete follow up appt. Once completed, patient will be contacted for scheduling upon review by the RN Navigator.

## 2018-08-25 NOTE — Telephone Encounter (Signed)
Pt returned phone call and stated he would like to attend CR here in [place]. Explained scheduling process and went over insurance, patient verbalized understanding. Will contact patient for scheduling once f/u has been completed.

## 2018-08-26 ENCOUNTER — Ambulatory Visit (INDEPENDENT_AMBULATORY_CARE_PROVIDER_SITE_OTHER): Payer: Self-pay | Admitting: Thoracic Surgery (Cardiothoracic Vascular Surgery)

## 2018-08-26 ENCOUNTER — Encounter: Payer: Self-pay | Admitting: Thoracic Surgery (Cardiothoracic Vascular Surgery)

## 2018-08-26 ENCOUNTER — Other Ambulatory Visit: Payer: Self-pay

## 2018-08-26 VITALS — BP 118/75 | HR 84 | Temp 97.7°F | Resp 20 | Ht 68.0 in | Wt 161.0 lb

## 2018-08-26 DIAGNOSIS — Z951 Presence of aortocoronary bypass graft: Secondary | ICD-10-CM

## 2018-08-26 DIAGNOSIS — I2510 Atherosclerotic heart disease of native coronary artery without angina pectoris: Secondary | ICD-10-CM

## 2018-08-26 NOTE — Progress Notes (Signed)
      [address]       [place] [address]             [phone]        Joseph Liu [place] Medical Record #[id number] Date of Birth: 1951-01-01  Referring: Rossie Muskrat, DO Primary Care: Rossie Muskrat, DO Primary Cardiologist:No primary care provider on file.  Reason for visit:   follow-up  History of Present Illness:     Doing well.  Feels stronger every day.  Staying with son.  Sleeping on recliner downstairs     Physical Exam: BP 118/75   Pulse 84   Temp 97.7 F (36.5 C) (Skin)   Resp 20   Ht 5\' 8"  (1.727 m)   Wt 161 lb (73 kg)   SpO2 97% Comment: RA  BMI 24.48 kg/m   Alert NAD  Incision healing well Abdomen soft, NT/ND no peripheral edema   Diagnostic Studies & Laboratory data:     Assessment / Plan:   68 yo male s/p CABG, here for 1 week follow-up doing well Will meet with endocrinology and cardiology in the next few weeks Will f/u in 1 month   Joseph Liu O Joseph Liu 08/26/2018 1:32 PM

## 2018-08-30 ENCOUNTER — Other Ambulatory Visit: Payer: Self-pay

## 2018-09-02 ENCOUNTER — Encounter: Payer: Self-pay | Admitting: Internal Medicine

## 2018-09-02 ENCOUNTER — Other Ambulatory Visit: Payer: Self-pay

## 2018-09-02 ENCOUNTER — Ambulatory Visit (INDEPENDENT_AMBULATORY_CARE_PROVIDER_SITE_OTHER): Payer: Medicare PPO | Admitting: Internal Medicine

## 2018-09-02 ENCOUNTER — Ambulatory Visit: Payer: Medicare PPO | Admitting: Thoracic Surgery (Cardiothoracic Vascular Surgery)

## 2018-09-02 VITALS — BP 128/68 | HR 81 | Temp 98.4°F | Ht 64.0 in | Wt 160.2 lb

## 2018-09-02 DIAGNOSIS — E11649 Type 2 diabetes mellitus with hypoglycemia without coma: Secondary | ICD-10-CM

## 2018-09-02 DIAGNOSIS — E785 Hyperlipidemia, unspecified: Secondary | ICD-10-CM | POA: Diagnosis not present

## 2018-09-02 DIAGNOSIS — Z794 Long term (current) use of insulin: Secondary | ICD-10-CM

## 2018-09-02 DIAGNOSIS — E1165 Type 2 diabetes mellitus with hyperglycemia: Secondary | ICD-10-CM

## 2018-09-02 DIAGNOSIS — E1159 Type 2 diabetes mellitus with other circulatory complications: Secondary | ICD-10-CM

## 2018-09-02 NOTE — Patient Instructions (Addendum)
-   STOP Glipizide  - Decrease Novolin Mix to 14 units Before your First meal of the day and 14 units Before Supper    - Check sugar before breakfast and supper and when you don't feel good    - HOW TO TREAT LOW BLOOD SUGARS (Blood sugar LESS THAN 70 MG/DL)  Please follow the RULE OF 15 for the treatment of hypoglycemia treatment (when your (blood sugars are less than 70 mg/dL)    STEP 1: Take 15 grams of carbohydrates when your blood sugar is low, which includes:   3-4 GLUCOSE TABS  OR  3-4 OZ OF JUICE OR REGULAR SODA OR  ONE TUBE OF GLUCOSE GEL     STEP 2: RECHECK blood sugar in 15 MINUTES STEP 3: If your blood sugar is still low at the 15 minute recheck --> then, go back to STEP 1 and treat AGAIN with another 15 grams of carbohydrates.

## 2018-09-02 NOTE — Progress Notes (Signed)
Name: Joseph Liu  MRN/ DOB: [id number], September 08, 1950   Age/ Sex: 68 y.o., male    PCP: Chana Bode, DO   Reason for Endocrinology Evaluation: Type 2 Diabetes Mellitus     Date of Initial Endocrinology Visit: 09/02/2018     PATIENT IDENTIFIER: Joseph Liu is a 68 y.o. male with a past medical history of HTN, DM, CAD (S/P CABG 08/2018). The patient presented for initial endocrinology clinic visit on 09/02/2018 for consultative assistance with his diabetes management.    HPI: Joseph Liu is here with 2 sons today, he has been living with Homero Fellers since her CABG   Diagnosed with DM 10 yrs  Prior Medications tried/Intolerance: Glipizide, glimepiride, Metformin, has been on insulin for years Currently checking blood sugars 3 x / day,  before meals  Hypoglycemia episodes : yes        Symptoms: no                Frequency: daily  Hemoglobin A1c 9.5% at presentation Patient required assistance for hypoglycemia: no Patient has required hospitalization within the last 1 year from hyper or hypoglycemia: no  In terms of diet, the patient eats 3 meals , snacks at times. Drinks juice.    HOME DIABETES REGIMEN: Glipizide XL 10 mg BID  Novolin 70/30 25 units BID  Statin: yes ACE-I/ARB: yes Prior Diabetic Education: yes   METER DOWNLOAD SUMMARY: Date range evaluated: 7/30-8/28 Fingerstick Blood Glucose Tests = 45 Average Number Tests/Day = 1.5 Overall Mean FS Glucose = 148 Standard Deviation = 80.3  BG Ranges: Low = 48 High = 401   Hypoglycemic Events/30 Days: BG < 50 = 1 Episodes of symptomatic severe hypoglycemia = 0   DIABETIC COMPLICATIONS: Microvascular complications:   Low GFR, unclear chronicity   Denies: retinopathy , neuropathy  Last eye exam: Completed years ago  Macrovascular complications:   CAD  Denies:  PVD, CVA   PAST HISTORY: Past Medical History:  Past Medical History:  Diagnosis Date   CKD (chronic kidney disease)    DM  (diabetes mellitus) (HCC)    GERD (gastroesophageal reflux disease)    Gout    HTN (hypertension)    Hypothyroidism    Past Surgical History:  Past Surgical History:  Procedure Laterality Date   BACK SURGERY     CORONARY ARTERY BYPASS GRAFT N/A 08/18/2018   Procedure: CORONARY ARTERY BYPASS GRAFTING (CABG) times three using left internal mammary artery and right saphenous vein. Aborted attempt left leg vein harvest.;  Surgeon: Corliss Skains, MD;  Location[facility] OR;  Service: Open Heart Surgery;  Laterality: N/A;   TEE WITHOUT CARDIOVERSION N/A 08/18/2018   Procedure: TRANSESOPHAGEAL ECHOCARDIOGRAM (TEE);  Surgeon: Corliss Skains, MD;  Location: [facility] OR;  Service: Open Heart Surgery;  Laterality: N/A;      Social History:  reports that he has never smoked. He has never used smokeless tobacco. He reports previous alcohol use. He reports that he does not use drugs. Family History:  Family History  Problem Relation Age of Onset   Aneurysm Mother    Diabetes Sister    Pancreatic cancer Brother    Diabetes Brother      HOME MEDICATIONS: Allergies as of 09/02/2018      Reactions   Codeine Nausea Only   Sudafed [pseudoephedrine Hcl] Other (See Comments)   Renal problems      Medication List       Accurate as of September 02, 2018 12:20 PM. If you  have any questions, ask your nurse or doctor.        STOP taking these medications   glipiZIDE 10 MG 24 hr tablet Commonly known as: GLUCOTROL XL Stopped by: Scarlette ShortsIbtehal J Sonni Barse, MD     TAKE these medications   acetaminophen 500 MG tablet Commonly known as: TYLENOL Take 1,000 mg by mouth every 6 (six) hours as needed for mild pain.   allopurinol 100 MG tablet Commonly known as: ZYLOPRIM Take 100 mg by mouth daily.   aspirin 325 MG EC tablet Take 1 tablet (325 mg total) by mouth daily.   atorvastatin 40 MG tablet Commonly known as: LIPITOR Take 1 tablet (40 mg total) by mouth daily.   cholecalciferol 25  MCG (1000 UT) tablet Commonly known as: VITAMIN D3 Take 1,000 Units by mouth daily.   furosemide 40 MG tablet Commonly known as: LASIX Take 1 tablet (40 mg total) by mouth daily. For 5 days then stop.   levothyroxine 100 MCG tablet Commonly known as: SYNTHROID Take 100 mcg by mouth daily before breakfast.   lisinopril 5 MG tablet Commonly known as: ZESTRIL Take 1 tablet (5 mg total) by mouth daily.   metoprolol tartrate 50 MG tablet Commonly known as: LOPRESSOR Take 1 tablet (50 mg total) by mouth 2 (two) times daily.   NovoLIN 70/30 FlexPen (70-30) 100 UNIT/ML PEN Generic drug: Insulin Isophane & Regular Human Inject 14 Units into the skin 2 (two) times daily before a meal.   Potassium Chloride ER 20 MEQ Tbcr Take 20 mEq by mouth daily. For 5 days then stop.   traMADol 50 MG tablet Commonly known as: ULTRAM Take 1 tablet (50 mg total) by mouth every 4 (four) hours as needed for moderate pain.        ALLERGIES: Allergies  Allergen Reactions   Codeine Nausea Only   Sudafed [Pseudoephedrine Hcl] Other (See Comments)    Renal problems     REVIEW OF SYSTEMS: A comprehensive ROS was conducted with the patient and is negative except as per HPI and below:  Review of Systems  Constitutional: Positive for malaise/fatigue and weight loss.  HENT: Negative for congestion and sore throat.   Eyes: Negative for blurred vision and pain.  Respiratory: Negative for cough and shortness of breath.   Cardiovascular: Positive for chest pain. Negative for palpitations.  Gastrointestinal: Negative for nausea and vomiting.  Genitourinary: Positive for frequency.  Skin: Negative.   Neurological: Negative for tingling and tremors.  Endo/Heme/Allergies: Positive for polydipsia.      OBJECTIVE:   VITAL SIGNS: BP 128/68 (BP Location: Left Arm, Patient Position: Sitting, Cuff Size: Normal)    Pulse 81    Temp 98.4 F (36.9 C)    Ht 5\' 4"  (1.626 m)    Wt 160 lb 3.2 oz (72.7 kg)     SpO2 98%    BMI 27.50 kg/m    PHYSICAL EXAM:  General: Pt  in NAD in a wheelchair   Hydration: Well-hydrated with moist mucous membranes and good skin turgor  HEENT: Head: Unremarkable with good dentition. Oropharynx clear without exudate.  Eyes: External eye exam normal without stare, lid lag or exophthalmos.  EOM intact.   Neck: General: Supple without adenopathy or carotid bruits. Thyroid: Thyroid size normal.  No goiter or nodules appreciated. No thyroid bruit.  Lungs: Clear with good BS bilat with no rales, rhonchi, or wheezes  Heart: RRR with normal S1 and S2 and no gallops; no murmurs; no rub  Abdomen: Normoactive  bowel sounds, soft, nontender, without masses or organomegaly palpable  Extremities:  Lower extremities - No pretibial edema. No lesions.  Skin: Normal texture and temperature to palpation. No rash noted. No Acanthosis nigricans/skin tags. No lipohypertrophy.  Neuro: MS is good with appropriate affect, pt is alert and Ox3    DM foot exam: deferred Right foot boot   DATA REVIEWED:  Lab Results  Component Value Date   HGBA1C 9.5 (H) 08/17/2018   Lab Results  Component Value Date   CREATININE 1.44 (H) 08/22/2018    ASSESSMENT / PLAN / RECOMMENDATIONS:   1) Type 2 Diabetes Mellitus, Poorly controlled, With Macrovascular complications and hypoglycemia unawareness - Most recent A1c of 9.5 %. Goal A1c < 7.0 %.    Plan: GENERAL: I have discussed with the patient the pathophysiology of diabetes. We went over the natural progression of the disease. We talked about both insulin resistance and insulin deficiency. We stressed the importance of lifestyle changes including diet and exercise. I explained the complications associated with diabetes including retinopathy, nephropathy, neuropathy as well as increased risk of cardiovascular disease. We went over the benefit seen with glycemic control.    I explained to the patient that diabetic patients are at higher than normal  risk for amputations.  Pt with recurrent hypoglycemia, he has no symptoms during these episodes, will have to relax his BG readings in the next few months in the hopes that he regains awareness.   Stop Glipizide  Will reduce insulin mix dose as below   MEDICATIONS: -  STOP Glipizide  - Decrease Novolin Mix to 14 units Before your First meal of the day and 14 units Before Supper   EDUCATION / INSTRUCTIONS:  BG monitoring instructions: Patient is instructed to check his blood sugars 2 times a day, before breakfast and supper.  Call [facility] if: BG persistently < 70 or > 300.  I reviewed the Rule of 15 for the treatment of hypoglycemia in detail with the patient. Literature supplied.   2) Diabetic complications:   Eye: Unknown to  have known diabetic retinopathy. Pt urged to seek an ophthalmology exam   Neuro/ Feet: Does not have known diabetic peripheral neuropathy.  Renal: Patient does have low GFR during hospitalization, unclear chronicity . He is on an ACEI/ARB at present.  3) Lipids: Patient is on a statin.    4) Hypertension: He is  at goal of < 140/90 mmHg.   5) Right Foot Ulcer :   - Unable to perform exam  - Pt urged to seek attention from podiatry     F/u in 6 weeks    Signed electronically by: Mack Guise, MD  [facility]  [place] [address] [place], [place] [address] Phone: [phone] FAX: [phone]   CC: Rossie Muskrat, DO No address on file Phone: None  Fax: [phone]    Return to Endocrinology clinic as below: Future Appointments  Date Time Provider [place]  09/06/2018  4:00 PM Lelon Perla, MD [facility]  09/30/2018 10:30 AM Lajuana Matte, MD [facility]  10/14/2018  7:50 AM Ozro Russett, Melanie Crazier, MD [facility] None

## 2018-09-05 NOTE — Progress Notes (Signed)
Referring-Ayokunle Fatade DO Reason for referral-coronary artery disease  HPI: 68 year old male for evaluation of coronary artery disease at request of Ayokunle Fatade DO.  Recently admitted to [facility] with nausea, vomiting and hypertensive urgency.  Also bradycardic. Had cath revealing LAD, diagonal and Lcx disease.  Patient was transferred to [facility] and underwent coronary artery bypass graft in August 2020 with a LIMA to the LAD, saphenous vein graft to the first and second diagonal.  Note preoperative carotid Doppler showed 1-39% bilateral stenosis.  Since bypass surgery patient is recovering well.  He has dyspnea with activities at times and some weakness but improving.  No orthopnea, PND, palpitations, syncope or exertional chest pain.  Some residual chest soreness.  Current Outpatient Medications  Medication Sig Dispense Refill  . acetaminophen (TYLENOL) 500 MG tablet Take 1,000 mg by mouth every 6 (six) hours as needed for mild pain.    Marland Kitchen allopurinol (ZYLOPRIM) 100 MG tablet Take 100 mg by mouth daily.    Marland Kitchen aspirin EC 325 MG EC tablet Take 1 tablet (325 mg total) by mouth daily. 30 tablet 0  . atorvastatin (LIPITOR) 40 MG tablet Take 1 tablet (40 mg total) by mouth daily. 30 tablet 1  . cholecalciferol (VITAMIN D3) 25 MCG (1000 UT) tablet Take 1,000 Units by mouth daily.    Marland Kitchen levothyroxine (SYNTHROID) 100 MCG tablet Take 100 mcg by mouth daily before breakfast.    . lisinopril (ZESTRIL) 5 MG tablet Take 1 tablet (5 mg total) by mouth daily. 30 tablet 1  . metoprolol tartrate (LOPRESSOR) 50 MG tablet Take 1 tablet (50 mg total) by mouth 2 (two) times daily. 60 tablet 1  . NOVOLIN 70/30 FLEXPEN (70-30) 100 UNIT/ML PEN Inject 14 Units into the skin 2 (two) times daily before a meal.    . traMADol (ULTRAM) 50 MG tablet Take 1 tablet (50 mg total) by mouth every 4 (four) hours as needed for moderate pain. 30 tablet 0   No current facility-administered medications for  this visit.     Allergies  Allergen Reactions  . Codeine Nausea Only  . Sudafed [Pseudoephedrine Hcl] Other (See Comments)    Renal problems     Past Medical History:  Diagnosis Date  . CAD (coronary artery disease)   . CKD (chronic kidney disease)   . DM (diabetes mellitus) ([place])   . GERD (gastroesophageal reflux disease)   . Gout   . HTN (hypertension)   . Hyperlipidemia   . Hypothyroidism     Past Surgical History:  Procedure Laterality Date  . BACK SURGERY    . CORONARY ARTERY BYPASS GRAFT N/A 08/18/2018   Procedure: CORONARY ARTERY BYPASS GRAFTING (CABG) times three using left internal mammary artery and right saphenous vein. Aborted attempt left leg vein harvest.;  Surgeon: Lajuana Matte, MD;  Location: [place];  Service: Open Heart Surgery;  Laterality: N/A;  . TEE WITHOUT CARDIOVERSION N/A 08/18/2018   Procedure: TRANSESOPHAGEAL ECHOCARDIOGRAM (TEE);  Surgeon: Lajuana Matte, MD;  Location: [place];  Service: Open Heart Surgery;  Laterality: N/A;    Social History   Socioeconomic History  . Marital status: Divorced    Spouse name: Not on file  . Number of children: 2  . Years of education: Not on file  . Highest education level: Not on file  Occupational History  . Not on file  Social Needs  . Financial resource strain: Not on file  . Food insecurity    Worry:  Not on file    Inability: Not on file  . Transportation needs    Medical: Not on file    Non-medical: Not on file  Tobacco Use  . Smoking status: Never Smoker  . Smokeless tobacco: Never Used  Substance and Sexual Activity  . Alcohol use: Not Currently  . Drug use: Never  . Sexual activity: Not on file  Lifestyle  . Physical activity    Days per week: Not on file    Minutes per session: Not on file  . Stress: Not on file  Relationships  . Social Musicianconnections    Talks on phone: Not on file    Gets together: Not on file    Attends religious service: Not on file    Active member of  club or organization: Not on file    Attends meetings of clubs or organizations: Not on file    Relationship status: Not on file  . Intimate partner violence    Fear of current or ex partner: Not on file    Emotionally abused: Not on file    Physically abused: Not on file    Forced sexual activity: Not on file  Other Topics Concern  . Not on file  Social History Narrative  . Not on file    Family History  Problem Relation Age of Onset  . Aneurysm Mother   . Diabetes Sister   . Pancreatic cancer Brother   . Diabetes Brother     ROS: Some right foot pain and chest soreness but no fevers or chills, productive cough, hemoptysis, dysphasia, odynophagia, melena, hematochezia, dysuria, hematuria, rash, seizure activity, orthopnea, PND, claudication. Remaining systems are negative.  Physical Exam:   Blood pressure 128/78, pulse 71, temperature (!) 97.3 F (36.3 C), height 5\' 8"  (1.727 m).  General:  Well developed/well nourished in NAD Skin warm/dry Patient not depressed No peripheral clubbing Back-normal HEENT-normal/normal eyelids Neck supple/normal carotid upstroke bilaterally; no bruits; no JVD; no thyromegaly chest - CTA/ normal expansion; status post sternotomy.  No evidence of infection. CV - RRR/normal S1 and S2; no murmurs, rubs or gallops;  PMI nondisplaced Abdomen -NT/ND, no HSM, no mass, + bowel sounds, no bruit 2+ femoral pulses, no bruits Ext-no edema, chords, 2+ DP; venous harvest sites without evidence of infection.  Some tenderness to palpation over first metatarsal joint. Neuro-grossly nonfocal  ECG -August 19, 2018-sinus rhythm, left ventricular hypertrophy, nonspecific ST changes.  Personally reviewed  A/P  1 coronary artery disease status post coronary artery bypass graft-continue aspirin and statin.  2 ischemic cardiomyopathy-Plan echo to assess LV function.  Continue ACE inhibitor and beta-blocker.  3 hyperlipidemia-given documented coronary disease  we will increase Lipitor to 80 mg daily.  Check lipids and liver in 6 weeks.  4 hypertension-patient's blood pressure is controlled.  Continue present medications and follow.  5 chronic stage III kidney disease-followed by primary care.  Olga MillersBrian Rebakah Cokley, MD

## 2018-09-06 ENCOUNTER — Encounter: Payer: Self-pay | Admitting: Cardiology

## 2018-09-06 ENCOUNTER — Ambulatory Visit (INDEPENDENT_AMBULATORY_CARE_PROVIDER_SITE_OTHER): Payer: Medicare PPO | Admitting: Cardiology

## 2018-09-06 ENCOUNTER — Other Ambulatory Visit: Payer: Self-pay

## 2018-09-06 VITALS — BP 128/78 | HR 71 | Temp 97.3°F | Ht 68.0 in

## 2018-09-06 DIAGNOSIS — I2510 Atherosclerotic heart disease of native coronary artery without angina pectoris: Secondary | ICD-10-CM

## 2018-09-06 DIAGNOSIS — I10 Essential (primary) hypertension: Secondary | ICD-10-CM | POA: Diagnosis not present

## 2018-09-06 DIAGNOSIS — Z951 Presence of aortocoronary bypass graft: Secondary | ICD-10-CM | POA: Diagnosis not present

## 2018-09-06 DIAGNOSIS — E7800 Pure hypercholesterolemia, unspecified: Secondary | ICD-10-CM

## 2018-09-06 MED ORDER — ATORVASTATIN CALCIUM 80 MG PO TABS: 80.0000 mg | ORAL_TABLET | Freq: Every day | ORAL | 3 refills | Status: DC

## 2018-09-06 NOTE — Patient Instructions (Signed)
Medication Instructions:  INCREASE ATORVASTATIN TO 80 MG ONCE DAILY= 2 OF THE 40 MG TABLETS ONCE DAILY  If you need a refill on your cardiac medications before your next appointment, please call your pharmacy.   Lab work: Your physician recommends that you return for lab work in: [place]   If you have labs (blood work) drawn today and your tests are completely normal, you will receive your results only by: [facility] Kitchen MyChart Message (if you have MyChart) OR . A paper copy in the mail If you have any lab test that is abnormal or we need to change your treatment, we will call you to review the results.  Testing/Procedures: Your physician has requested that you have an echocardiogram. Echocardiography is a painless test that uses sound waves to create images of your heart. It provides your doctor with information about the size and shape of your heart and how well your heart's chambers and valves are working. This procedure takes approximately one hour. There are no restrictions for this procedure.[place]    Follow-Up: At [facility], you and your health needs are our priority.  As part of our continuing mission to provide you with exceptional heart care, we have created designated Provider Care Teams.  These Care Teams include your primary Cardiologist (physician) and Advanced Practice Providers (APPs -  Physician Assistants and Nurse Practitioners) who all work together to provide you with the care you need, when you need it. Your physician recommends that you schedule a follow-up appointment in: [place]

## 2018-09-07 ENCOUNTER — Ambulatory Visit: Payer: Medicare PPO | Admitting: Cardiology

## 2018-09-07 DIAGNOSIS — M109 Gout, unspecified: Secondary | ICD-10-CM | POA: Insufficient documentation

## 2018-09-07 DIAGNOSIS — E785 Hyperlipidemia, unspecified: Secondary | ICD-10-CM | POA: Insufficient documentation

## 2018-09-07 DIAGNOSIS — H409 Unspecified glaucoma: Secondary | ICD-10-CM | POA: Insufficient documentation

## 2018-09-09 ENCOUNTER — Ambulatory Visit (HOSPITAL_COMMUNITY): Payer: Medicare PPO | Attending: Cardiology

## 2018-09-09 ENCOUNTER — Other Ambulatory Visit: Payer: Self-pay

## 2018-09-09 ENCOUNTER — Telehealth (HOSPITAL_COMMUNITY): Payer: Self-pay

## 2018-09-09 DIAGNOSIS — Z951 Presence of aortocoronary bypass graft: Secondary | ICD-10-CM | POA: Diagnosis not present

## 2018-09-09 DIAGNOSIS — I2510 Atherosclerotic heart disease of native coronary artery without angina pectoris: Secondary | ICD-10-CM | POA: Diagnosis not present

## 2018-09-09 DIAGNOSIS — E785 Hyperlipidemia, unspecified: Secondary | ICD-10-CM | POA: Diagnosis not present

## 2018-09-09 DIAGNOSIS — I119 Hypertensive heart disease without heart failure: Secondary | ICD-10-CM | POA: Diagnosis not present

## 2018-09-09 DIAGNOSIS — I081 Rheumatic disorders of both mitral and tricuspid valves: Secondary | ICD-10-CM | POA: Diagnosis not present

## 2018-09-09 LAB — LIPID PANEL
Chol/HDL Ratio: 4.2 ratio (ref 0.0–5.0)
Cholesterol, Total: 168 mg/dL (ref 100–199)
HDL: 40 mg/dL (ref >39)
LDL Chol Calc (NIH): 107 mg/dL — ABNORMAL HIGH (ref 0–99)
Triglycerides: 115 mg/dL (ref 0–149)
VLDL Cholesterol Cal: 21 mg/dL (ref 5–40)

## 2018-09-09 LAB — HEPATIC FUNCTION PANEL
ALT: 17 IU/L (ref 0–44)
AST: 17 IU/L (ref 0–40)
Albumin: 3.9 g/dL (ref 3.8–4.8)
Alkaline Phosphatase: 65 IU/L (ref 39–117)
Bilirubin Total: 0.3 mg/dL (ref 0.0–1.2)
Bilirubin, Direct: 0.09 mg/dL (ref 0.00–0.40)
Total Protein: 6.4 g/dL (ref 6.0–8.5)

## 2018-09-09 NOTE — Telephone Encounter (Signed)
Called patient to see if he was interested in participating in the Cardiac Rehab Program. Patient stated yes. Patient will come in for orientation on 09/27/2018 @ 930AM and will attend the 115PM exercise class.  Explained scheduling process and went over insurance, patient verbalized understanding.   Mailed letter.

## 2018-09-14 ENCOUNTER — Other Ambulatory Visit: Payer: Self-pay | Admitting: Physician Assistant

## 2018-09-14 ENCOUNTER — Other Ambulatory Visit: Payer: Self-pay

## 2018-09-14 DIAGNOSIS — E7800 Pure hypercholesterolemia, unspecified: Secondary | ICD-10-CM

## 2018-09-19 ENCOUNTER — Other Ambulatory Visit: Payer: Self-pay | Admitting: Podiatry

## 2018-09-19 ENCOUNTER — Other Ambulatory Visit: Payer: Self-pay

## 2018-09-19 ENCOUNTER — Ambulatory Visit (INDEPENDENT_AMBULATORY_CARE_PROVIDER_SITE_OTHER): Payer: Medicare PPO

## 2018-09-19 ENCOUNTER — Encounter: Payer: Self-pay | Admitting: Podiatry

## 2018-09-19 ENCOUNTER — Ambulatory Visit: Payer: Medicare PPO | Admitting: Podiatry

## 2018-09-19 DIAGNOSIS — M76821 Posterior tibial tendinitis, right leg: Secondary | ICD-10-CM

## 2018-09-19 DIAGNOSIS — M76822 Posterior tibial tendinitis, left leg: Secondary | ICD-10-CM

## 2018-09-19 DIAGNOSIS — B351 Tinea unguium: Secondary | ICD-10-CM | POA: Diagnosis not present

## 2018-09-19 DIAGNOSIS — M79671 Pain in right foot: Secondary | ICD-10-CM

## 2018-09-19 DIAGNOSIS — E1140 Type 2 diabetes mellitus with diabetic neuropathy, unspecified: Secondary | ICD-10-CM | POA: Diagnosis not present

## 2018-09-19 DIAGNOSIS — M79674 Pain in right toe(s): Secondary | ICD-10-CM | POA: Diagnosis not present

## 2018-09-19 DIAGNOSIS — R600 Localized edema: Secondary | ICD-10-CM

## 2018-09-19 DIAGNOSIS — M79675 Pain in left toe(s): Secondary | ICD-10-CM

## 2018-09-19 DIAGNOSIS — E1149 Type 2 diabetes mellitus with other diabetic neurological complication: Secondary | ICD-10-CM

## 2018-09-19 DIAGNOSIS — M79672 Pain in left foot: Secondary | ICD-10-CM | POA: Diagnosis not present

## 2018-09-19 NOTE — Progress Notes (Signed)
Subjective:   Patient ID: Joseph Liu, male   DOB: 68 y.o.   MRN: [id number]   HPI Patient presents with caregiver stating he is having a lot of pain in both of his ankles and had bypass surgery approximately 1 month ago.  Patient states that he also has nail disease he cannot take care of and he is supposed to start rehab and needs to get improved.  Patient does not smoke   Review of Systems  All other systems reviewed and are negative.       Objective:  Physical Exam Vitals signs and nursing note reviewed.  Constitutional:      Appearance: He is well-developed.  Pulmonary:     Effort: Pulmonary effort is normal.  Musculoskeletal: Normal range of motion.  Skin:    General: Skin is warm.  Neurological:     Mental Status: He is alert.     Neurovascular status was found to be intact with +1 pitting edema left and mild swelling on the right ankle.  Patient has quite a bit of discomfort in the posterior tibial tendon as it comes underneath the medial malleolus bilateral and into the insertion navicular with moderate depression of the arch and patient was found to have thickened yellow brittle nailbeds 1-5 both feet that are moderately tender.  Patient did have good digital perfusion and is well oriented      Assessment:  Probability for bilateral posterior tibial tendinitis with swelling occurring which is probably related to the surgery that he had     Plan:  H&P conditions reviewed and at this point I did inject the posterior tibial tendon sheath 3 mg Dexasone Kenalog 5 mg Xylocaine bilateral applied ankle compression stockings bilateral to help control swelling and debrided nailbeds 1-5 both feet with no iatrogenic bleeding noted.  Reappoint for [place] to recheck his symptoms indicate  X-rays indicate that patient has moderate depression of the arch but no other indications of significant pathology

## 2018-09-21 ENCOUNTER — Telehealth (HOSPITAL_COMMUNITY): Payer: Self-pay

## 2018-09-21 NOTE — Telephone Encounter (Signed)
Cardiac Rehab Medication Review by a Pharmacist  Does the patient  feel that his/her medications are working for him/her?  yes  Has the patient been experiencing any side effects to the medications prescribed?  no  Does the patient measure his/her own blood pressure or blood glucose at home?  Yes. BP monitored twice daily. Lately fluctuating up to 156-165/70-80s. Blood glucose monitored two-three times. Usually 87-120 in the AM.   Does the patient have any problems obtaining medications due to transportation or finances?   no  Understanding of regimen: good Understanding of indications: good Potential of compliance: good    Pharmacist comments: N/A   Joseph Liu 09/21/2018 2:44 PM

## 2018-09-22 ENCOUNTER — Other Ambulatory Visit: Payer: Self-pay

## 2018-09-22 DIAGNOSIS — Z951 Presence of aortocoronary bypass graft: Secondary | ICD-10-CM

## 2018-09-22 MED ORDER — ATORVASTATIN CALCIUM 80 MG PO TABS: 80.0000 mg | ORAL_TABLET | Freq: Every day | ORAL | 3 refills | Status: DC

## 2018-09-26 ENCOUNTER — Telehealth (HOSPITAL_COMMUNITY): Payer: Self-pay | Admitting: *Deleted

## 2018-09-26 ENCOUNTER — Other Ambulatory Visit: Payer: Self-pay

## 2018-09-26 ENCOUNTER — Telehealth: Payer: Self-pay | Admitting: Podiatry

## 2018-09-26 NOTE — Telephone Encounter (Signed)
Spoke with patient. Completed telephone interview with the patient over the phone.[place] orientation appointment for tomorrow morning.Barnet Pall, RN,BSN 09/26/2018 4:42 PM

## 2018-09-26 NOTE — Telephone Encounter (Signed)
That's fine

## 2018-09-26 NOTE — Telephone Encounter (Signed)
I informed Cardiac Rehab Department - [facility] [place] of Dr. Mellody Drown 09/26/2018 3:52pm medical clearance.

## 2018-09-26 NOTE — Telephone Encounter (Signed)
States pt is starting cardiac rehab tomorrow. Needs medical clearance for him to be weight bearing. Will need something in writing faxed to [place] at [phone].

## 2018-09-27 ENCOUNTER — Other Ambulatory Visit: Payer: Self-pay

## 2018-09-27 ENCOUNTER — Encounter (HOSPITAL_COMMUNITY)
Admission: RE | Admit: 2018-09-27 | Discharge: 2018-09-27 | Disposition: A | Discharge status: Home / Self Care | Payer: Medicare PPO | Source: Ambulatory Visit | Attending: Cardiology | Admitting: Cardiology

## 2018-09-27 ENCOUNTER — Encounter (HOSPITAL_COMMUNITY): Payer: Self-pay

## 2018-09-27 ENCOUNTER — Telehealth: Payer: Self-pay | Admitting: Podiatry

## 2018-09-27 VITALS — Ht 67.0 in | Wt 163.4 lb

## 2018-09-27 DIAGNOSIS — Z951 Presence of aortocoronary bypass graft: Secondary | ICD-10-CM | POA: Insufficient documentation

## 2018-09-27 DIAGNOSIS — Z79899 Other long term (current) drug therapy: Secondary | ICD-10-CM | POA: Insufficient documentation

## 2018-09-27 NOTE — Telephone Encounter (Signed)
Faxed medical clearance letter to [facility].

## 2018-09-27 NOTE — Progress Notes (Signed)
Cardiac Individual Treatment Plan  Patient Details  Name: Joseph Liu MRN: [id number] Date of Birth: 1950-10-11 Referring Provider:     CARDIAC REHAB PHASE II ORIENTATION from 09/27/2018 in [facility] CARDIAC [facility]  Referring Provider  Dr. 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  acetaminophen (TYLENOL) 500 MG tablet, Take 1,000 mg by mouth every 6 (six) hours as needed for mild pain., Disp: , Rfl:  .  allopurinol (ZYLOPRIM) 100 MG tablet, Take 100 mg by mouth daily., Disp: , Rfl:  .  aspirin EC 325 MG EC tablet, Take 1 tablet (325 mg total) by mouth daily., Disp: 30 tablet, Rfl: 0 .  atorvastatin (LIPITOR) 80 MG tablet, Take 1 tablet (80 mg total) by mouth daily., Disp: 90 tablet, Rfl: 3 .  cholecalciferol (VITAMIN D3) 25 MCG (1000 UT) tablet, Take 1,000 Units by mouth daily., Disp: , Rfl:  .  doxycycline (VIBRA-TABS) 100 MG tablet, doxycycline hyclate 100 mg tablet  Take 1 tablet twice a day by oral route for 21 days., Disp: , Rfl:  .  levothyroxine (SYNTHROID) 100 MCG tablet, Take 100 mcg by mouth daily before breakfast., Disp: , Rfl:  .  lisinopril (ZESTRIL) 5 MG tablet, Take 1 tablet by mouth 2 (two) times daily., Disp: , Rfl:  .  metoprolol tartrate (LOPRESSOR) 50 MG tablet, Take 1 tablet (50 mg total) by mouth 2 (two) times daily., Disp: 60 tablet, Rfl: 1 .  NOVOLIN 70/30 FLEXPEN (70-30) 100 UNIT/ML PEN, Inject 14 Units into the skin 2 (two) times daily before a meal., Disp: , Rfl:  .  traMADol (ULTRAM) 50 MG tablet, Take 1 tablet (50 mg total) by mouth every 4 (four) hours as needed for moderate pain., Disp: 30 tablet, Rfl: 0  Past Medical History: Past Medical History:  Diagnosis Date  . CAD (coronary artery disease)   . CKD  (chronic kidney disease)   . DM (diabetes mellitus) (HCC)   . GERD (gastroesophageal reflux disease)   . Gout   . HTN (hypertension)   . Hyperlipidemia   . Hypothyroidism     Tobacco Use: Social History   Tobacco Use  Smoking Status Never Smoker  Smokeless Tobacco Never Used    Labs: Recent Review Flowsheet Data    Labs for ITP Cardiac and Pulmonary Rehab Latest Ref Rng & Units 08/18/2018 08/18/2018 08/18/2018 08/18/2018 09/09/2018   Cholestrol 100 - 199 mg/dL - - - - 086   HDL >57 mg/dL - - - - 40   Trlycerides 0 - 149 mg/dL - - - - 846   Hemoglobin A1c 4.8 - 5.6 % - - - - -   PHART 7.350 - 7.450 7.430 7.331(L) - 7.360 -   PCO2ART 32.0 - 48.0 mmHg 28.7(L) 34.5 - 35.5 -   HCO3 20.0 - 28.0 mmol/L 19.3(L) 18.5(L) - 20.1 -   TCO2 22 - 32 mmol/L 20(L) 20(L) 19(L) 21(L) -   ACIDBASEDEF 0.0 - 2.0 mmol/L 5.0(H) 7.0(H) - 5.0(H) -   O2SAT % 99.0 98.0 - 99.0 -      Capillary Blood Glucose: Lab Results  Component Value Date   GLUCAP 214 (H) 08/23/2018   GLUCAP 200 (H) 08/23/2018   GLUCAP 121 (H) 08/23/2018  GLUCAP 155 (H) 08/22/2018   GLUCAP 192 (H) 08/22/2018     Exercise Target Goals: Exercise Program Goal: Individual exercise prescription set using results from initial 6 min walk test and THRR while considering  patient's activity barriers and safety.   Exercise Prescription Goal: Initial exercise prescription builds to 30-45 minutes a day of aerobic activity, 2-3 days per week.  Home exercise guidelines will be given to patient during program as part of exercise prescription that the participant will acknowledge.  Activity Barriers & Risk Stratification: Activity Barriers & Cardiac Risk Stratification - 09/27/18 1117      Activity Barriers & Cardiac Risk Stratification   Activity Barriers  Deconditioning;Muscular Weakness;Shortness of Breath;Assistive Device;Balance Concerns;Arthritis    Cardiac Risk Stratification  High       6 Minute Walk: 6 Minute Walk    Row Name  09/27/18 1116         6 Minute Walk   Phase  Initial     Distance  833 feet     Walk Time  6 minutes     # of Rest Breaks  0     MPH  1.5     METS  2.4     RPE  13     Perceived Dyspnea   1     VO2 Peak  8.63     Symptoms  Yes (comment)     Comments  SOB +1     Resting HR  69 bpm     Resting BP  170/80 Recheck: 134/84     Resting Oxygen Saturation   99 %     Exercise Oxygen Saturation  during 6 min walk  100 %     Max Ex. HR  81 bpm     Max Ex. BP  170/82     2 Minute Post BP  152/80        Oxygen Initial Assessment:   Oxygen Re-Evaluation:   Oxygen Discharge (Final Oxygen Re-Evaluation):   Initial Exercise Prescription: Initial Exercise Prescription - 09/27/18 1100      Date of Initial Exercise RX and Referring Provider   Date  09/27/18    Referring Provider  Dr. Stanford Breed    Expected Discharge Date  11/25/18      Recumbant Bike   Level  1.5    Watts  10    Minutes  15    METs  2.42      NuStep   Level  2    SPM  75    Minutes  15    METs  2      Prescription Details   Frequency (times per week)  3    Duration  Progress to 30 minutes of continuous aerobic without signs/symptoms of physical distress      Intensity   THRR 40-80% of Max Heartrate  62-123    Ratings of Perceived Exertion  11-13    Perceived Dyspnea  0-4      Progression   Progression  Continue to progress workloads to maintain intensity without signs/symptoms of physical distress.      Resistance Training   Training Prescription  Yes    Weight  3 lbs.     Reps  10-15       Perform Capillary Blood Glucose checks as needed.  Exercise Prescription Changes:   Exercise Comments:   Exercise Goals and Review:  Exercise Goals    Row Name 09/27/18 1120  Exercise Goals   Increase Physical Activity  Yes       Intervention  Provide advice, education, support and counseling about physical activity/exercise needs.;Develop an individualized exercise prescription for  aerobic and resistive training based on initial evaluation findings, risk stratification, comorbidities and participant's personal goals.       Expected Outcomes  Short Term: Attend rehab on a regular basis to increase amount of physical activity.;Long Term: Add in home exercise to make exercise part of routine and to increase amount of physical activity.;Long Term: Exercising regularly at least 3-5 days a week.       Increase Strength and Stamina  Yes       Intervention  Provide advice, education, support and counseling about physical activity/exercise needs.;Develop an individualized exercise prescription for aerobic and resistive training based on initial evaluation findings, risk stratification, comorbidities and participant's personal goals.       Expected Outcomes  Short Term: Increase workloads from initial exercise prescription for resistance, speed, and METs.;Short Term: Perform resistance training exercises routinely during rehab and add in resistance training at home;Long Term: Improve cardiorespiratory fitness, muscular endurance and strength as measured by increased METs and functional capacity ( )       Able to understand and use rate of perceived exertion (RPE) scale  Yes       Intervention  Provide education and explanation on how to use RPE scale       Expected Outcomes  Short Term: Able to use RPE daily in rehab to express subjective intensity level;Long Term:  Able to use RPE to guide intensity level when exercising independently       Able to understand and use Dyspnea scale  Yes       Intervention  Provide education and explanation on how to use Dyspnea scale       Expected Outcomes  Short Term: Able to use Dyspnea scale daily in rehab to express subjective sense of shortness of breath during exertion;Long Term: Able to use Dyspnea scale to guide intensity level when exercising independently       Knowledge and understanding of Target Heart Rate Range (THRR)  Yes        Intervention  Provide education and explanation of THRR including how the numbers were predicted and where they are located for reference       Expected Outcomes  Short Term: Able to state/look up THRR;Long Term: Able to use THRR to govern intensity when exercising independently;Short Term: Able to use daily as guideline for intensity in rehab       Able to check pulse independently  Yes       Intervention  Review the importance of being able to check your own pulse for safety during independent exercise;Provide education and demonstration on how to check pulse in carotid and radial arteries.       Expected Outcomes  Short Term: Able to explain why pulse checking is important during independent exercise;Long Term: Able to check pulse independently and accurately       Understanding of Exercise Prescription  Yes       Intervention  Provide education, explanation, and written materials on patient's individual exercise prescription       Expected Outcomes  Short Term: Able to explain program exercise prescription;Long Term: Able to explain home exercise prescription to exercise independently          Exercise Goals Re-Evaluation :   Discharge Exercise Prescription (Final Exercise Prescription Changes):   Nutrition:  Target  Goals: Understanding of nutrition guidelines, daily intake of sodium 1500mg , cholesterol 200mg , calories 30% from fat and 7% or less from saturated fats, daily to have 5 or more servings of fruits and vegetables.  Biometrics: Pre Biometrics - 09/27/18 1121      Pre Biometrics   Height  5\' 7"  (1.702 m)    Weight  74.1 kg    Waist Circumference  38 inches    Hip Circumference  38 inches    Waist to Hip Ratio  1 %    BMI (Calculated)  25.58    Triceps Skinfold  20 mm    % Body Fat  27.1 %    Grip Strength  19 kg    Flexibility  10 in    Single Leg Stand  3.68 seconds        Nutrition Therapy Plan and Nutrition Goals:   Nutrition Assessments:   Nutrition  Goals Re-Evaluation:   Nutrition Goals Re-Evaluation:   Nutrition Goals Discharge (Final Nutrition Goals Re-Evaluation):   Psychosocial: Target Goals: Acknowledge presence or absence of significant depression and/or stress, maximize coping skills, provide positive support system. Participant is able to verbalize types and ability to use techniques and skills needed for reducing stress and depression.  Initial Review & Psychosocial Screening: Initial Psych Review & Screening - 09/27/18 1050      Initial Review   Current issues with  None Identified      Family Dynamics   Good Support System?  Yes    Comments  There are no psychosocial barriers to exercise in CR and health management noted at this time. Patient has a positive attitude and utilizes healthy stress management. He states he has a healthy support system including family and friends.      Barriers   Psychosocial barriers to participate in program  There are no identifiable barriers or psychosocial needs.      Screening Interventions   Interventions  Encouraged to exercise       Quality of Life Scores: Quality of Life - 09/27/18 1122      Quality of Life   Select  Quality of Life      Quality of Life Scores   Health/Function Pre  19.6 %    Socioeconomic Pre  24 %    Psych/Spiritual Pre  22.29 %    Family Pre  25.2 %    GLOBAL Pre  21.88 %      Scores of 19 and below usually indicate a poorer quality of life in these areas.  A difference of  2-3 points is a clinically meaningful difference.  A difference of 2-3 points in the total score of the Quality of Life Index has been associated with significant improvement in overall quality of life, self-image, physical symptoms, and general health in studies assessing change in quality of life.  PHQ-9: Recent Review Flowsheet Data    Depression screen [facility] 2/9 09/27/2018   Decreased Interest 0   Down, Depressed, Hopeless 0   PHQ - 2 Score 0     Interpretation of  Total Score  Total Score Depression Severity:  1-4 = Minimal depression, 5-9 = Mild depression, 10-14 = Moderate depression, 15-19 = Moderately severe depression, 20-27 = Severe depression   Psychosocial Evaluation and Intervention:   Psychosocial Re-Evaluation:   Psychosocial Discharge (Final Psychosocial Re-Evaluation):   Vocational Rehabilitation: Provide vocational rehab assistance to qualifying candidates.   Vocational Rehab Evaluation & Intervention: Vocational Rehab - 09/27/18 1054  Initial Vocational Rehab Evaluation & Intervention   Assessment shows need for Vocational Rehabilitation  No       Education: Education Goals: Education classes will be provided on a weekly basis, covering required topics. Participant will state understanding/return demonstration of topics presented.  Learning Barriers/Preferences: Learning Barriers/Preferences - 09/27/18 1122      Learning Barriers/Preferences   Learning Barriers  None    Learning Preferences  Written Material       Education Topics: Count Your Pulse:  -Group instruction provided by verbal instruction, demonstration, patient participation and written materials to support subject.  Instructors address importance of being able to find your pulse and how to count your pulse when at home without a heart monitor.  Patients get hands on experience counting their pulse with staff help and individually.   Heart Attack, Angina, and Risk Factor Modification:  -Group instruction provided by verbal instruction, video, and written materials to support subject.  Instructors address signs and symptoms of angina and heart attacks.    Also discuss risk factors for heart disease and how to make changes to improve heart health risk factors.   Functional Fitness:  -Group instruction provided by verbal instruction, demonstration, patient participation, and written materials to support subject.  Instructors address safety measures for  doing things around the house.  Discuss how to get up and down off the floor, how to pick things up properly, how to safely get out of a chair without assistance, and balance training.   Meditation and Mindfulness:  -Group instruction provided by verbal instruction, patient participation, and written materials to support subject.  Instructor addresses importance of mindfulness and meditation practice to help reduce stress and improve awareness.  Instructor also leads participants through a meditation exercise.    Stretching for Flexibility and Mobility:  -Group instruction provided by verbal instruction, patient participation, and written materials to support subject.  Instructors lead participants through series of stretches that are designed to increase flexibility thus improving mobility.  These stretches are additional exercise for major muscle groups that are typically performed during regular warm up and cool down.   Hands Only CPR:  -Group verbal, video, and participation provides a basic overview of AHA guidelines for community CPR. Role-play of emergencies allow participants the opportunity to practice calling for help and chest compression technique with discussion of AED use.   Hypertension: -Group verbal and written instruction that provides a basic overview of hypertension including the most recent diagnostic guidelines, risk factor reduction with self-care instructions and medication management.    Nutrition I class: Heart Healthy Eating:  -Group instruction provided by PowerPoint slides, verbal discussion, and written materials to support subject matter. The instructor gives an explanation and review of the Therapeutic Lifestyle Changes diet recommendations, which includes a discussion on lipid goals, dietary fat, sodium, fiber, plant stanol/sterol esters, sugar, and the components of a well-balanced, healthy diet.   Nutrition II class: Lifestyle Skills:  -Group instruction  provided by PowerPoint slides, verbal discussion, and written materials to support subject matter. The instructor gives an explanation and review of label reading, grocery shopping for heart health, heart healthy recipe modifications, and ways to make healthier choices when eating out.   Diabetes Question & Answer:  -Group instruction provided by PowerPoint slides, verbal discussion, and written materials to support subject matter. The instructor gives an explanation and review of diabetes co-morbidities, pre- and post-prandial blood glucose goals, pre-exercise blood glucose goals, signs, symptoms, and treatment of hypoglycemia and hyperglycemia, and foot  care basics.   Diabetes Blitz:  -Group instruction provided by PowerPoint slides, verbal discussion, and written materials to support subject matter. The instructor gives an explanation and review of the physiology behind type 1 and type 2 diabetes, diabetes medications and rational behind using different medications, pre- and post-prandial blood glucose recommendations and Hemoglobin A1c goals, diabetes diet, and exercise including blood glucose guidelines for exercising safely.    Portion Distortion:  -Group instruction provided by PowerPoint slides, verbal discussion, written materials, and food models to support subject matter. The instructor gives an explanation of serving size versus portion size, changes in portions sizes over the last 20 years, and what consists of a serving from each food group.   Stress Management:  -Group instruction provided by verbal instruction, video, and written materials to support subject matter.  Instructors review role of stress in heart disease and how to cope with stress positively.     Exercising on Your Own:  -Group instruction provided by verbal instruction, power point, and written materials to support subject.  Instructors discuss benefits of exercise, components of exercise, frequency and intensity of  exercise, and end points for exercise.  Also discuss use of nitroglycerin and activating EMS.  Review options of places to exercise outside of rehab.  Review guidelines for sex with heart disease.   Cardiac Drugs I:  -Group instruction provided by verbal instruction and written materials to support subject.  Instructor reviews cardiac drug classes: antiplatelets, anticoagulants, beta blockers, and statins.  Instructor discusses reasons, side effects, and lifestyle considerations for each drug class.   Cardiac Drugs II:  -Group instruction provided by verbal instruction and written materials to support subject.  Instructor reviews cardiac drug classes: angiotensin converting enzyme inhibitors (ACE-I), angiotensin II receptor blockers (ARBs), nitrates, and calcium channel blockers.  Instructor discusses reasons, side effects, and lifestyle considerations for each drug class.   Anatomy and Physiology of the Circulatory System:  Group verbal and written instruction and models provide basic cardiac anatomy and physiology, with the coronary electrical and arterial systems. Review of: AMI, Angina, Valve disease, Heart Failure, Peripheral Artery Disease, Cardiac Arrhythmia, Pacemakers, and the ICD.   Other Education:  -Group or individual verbal, written, or video instructions that support the educational goals of the cardiac rehab program.   Holiday Eating Survival Tips:  -Group instruction provided by PowerPoint slides, verbal discussion, and written materials to support subject matter. The instructor gives patients tips, tricks, and techniques to help them not only survive but enjoy the holidays despite the onslaught of food that accompanies the holidays.   Knowledge Questionnaire Score: Knowledge Questionnaire Score - 09/27/18 1122      Knowledge Questionnaire Score   Pre Score  15/24       Core Components/Risk Factors/Patient Goals at Admission: Personal Goals and Risk Factors at  Admission - 09/27/18 1123      Core Components/Risk Factors/Patient Goals on Admission    Weight Management  Yes;Weight Maintenance    Intervention  Weight Management: Develop a combined nutrition and exercise program designed to reach desired caloric intake, while maintaining appropriate intake of nutrient and fiber, sodium and fats, and appropriate energy expenditure required for the weight goal.;Weight Management: Provide education and appropriate resources to help participant work on and attain dietary goals.    Admit Weight  163 lb 5.8 oz (74.1 kg)    Expected Outcomes  Short Term: Continue to assess and modify interventions until short term weight is achieved;Long Term: Adherence to nutrition and physical  activity/exercise program aimed toward attainment of established weight goal;Weight Maintenance: Understanding of the daily nutrition guidelines, which includes 25-35% calories from fat, 7% or less cal from saturated fats, less than 200mg  cholesterol, less than 1.5gm of sodium, & 5 or more servings of fruits and vegetables daily;Understanding recommendations for meals to include 15-35% energy as protein, 25-35% energy from fat, 35-60% energy from carbohydrates, less than 200mg  of dietary cholesterol, 20-35 gm of total fiber daily;Understanding of distribution of calorie intake throughout the day with the consumption of 4-5 meals/snacks    Diabetes  Yes    Intervention  Provide education about signs/symptoms and action to take for hypo/hyperglycemia.;Provide education about proper nutrition, including hydration, and aerobic/resistive exercise prescription along with prescribed medications to achieve blood glucose in normal ranges: Fasting glucose 65-99 mg/dL    Expected Outcomes  Short Term: Participant verbalizes understanding of the signs/symptoms and immediate care of hyper/hypoglycemia, proper foot care and importance of medication, aerobic/resistive exercise and nutrition plan for blood glucose  control.;Long Term: Attainment of HbA1C < 7%.    Hypertension  Yes    Intervention  Provide education on lifestyle modifcations including regular physical activity/exercise, weight management, moderate sodium restriction and increased consumption of fresh fruit, vegetables, and low fat dairy, alcohol moderation, and smoking cessation.;Monitor prescription use compliance.    Expected Outcomes  Short Term: Continued assessment and intervention until BP is < 140/26mm HG in hypertensive participants. < 130/29mm HG in hypertensive participants with diabetes, heart failure or chronic kidney disease.;Long Term: Maintenance of blood pressure at goal levels.    Lipids  Yes    Intervention  Provide education and support for participant on nutrition & aerobic/resistive exercise along with prescribed medications to achieve LDL 70mg , HDL >40mg .    Expected Outcomes  Short Term: Participant states understanding of desired cholesterol values and is compliant with medications prescribed. Participant is following exercise prescription and nutrition guidelines.;Long Term: Cholesterol controlled with medications as prescribed, with individualized exercise RX and with personalized nutrition plan. Value goals: LDL < 70mg , HDL > 40 mg.       Core Components/Risk Factors/Patient Goals Review:    Core Components/Risk Factors/Patient Goals at Discharge (Final Review):    ITP Comments: ITP Comments    Row Name 09/27/18 1001           ITP Comments  Medical Director- Dr. [id number], MD          Comments: Patient attended orientation on 09/27/2018 to review rules and guidelines for program.  Completed 6 minute walk test utilizing Rolator for stability, Intitial ITP, and exercise prescription.  VSS. Telemetry-NSR.  Asymptomatic. Safety measures and social distancing in place per CDC guidelines.

## 2018-09-27 NOTE — Telephone Encounter (Signed)
[facility] called stating they had spoken with the nurse, Marcy Siren yesterday and had verbal clearance from Dr. Paulla Dolly for the patient to start full cardiac rehab. Their office is needing [place] to send written clearance from Dr.Regal.   Please fax to [phone]

## 2018-09-28 ENCOUNTER — Other Ambulatory Visit: Payer: Self-pay | Admitting: Thoracic Surgery (Cardiothoracic Vascular Surgery)

## 2018-09-28 DIAGNOSIS — Z951 Presence of aortocoronary bypass graft: Secondary | ICD-10-CM

## 2018-09-29 ENCOUNTER — Other Ambulatory Visit: Payer: Self-pay

## 2018-09-30 ENCOUNTER — Encounter: Payer: Self-pay | Admitting: Thoracic Surgery (Cardiothoracic Vascular Surgery)

## 2018-09-30 ENCOUNTER — Ambulatory Visit
Admission: RE | Admit: 2018-09-30 | Discharge: 2018-09-30 | Disposition: A | Discharge status: Home / Self Care | Payer: Medicare PPO | Source: Ambulatory Visit | Attending: Thoracic Surgery (Cardiothoracic Vascular Surgery) | Admitting: Thoracic Surgery (Cardiothoracic Vascular Surgery)

## 2018-09-30 ENCOUNTER — Ambulatory Visit (INDEPENDENT_AMBULATORY_CARE_PROVIDER_SITE_OTHER): Payer: Self-pay | Admitting: Thoracic Surgery (Cardiothoracic Vascular Surgery)

## 2018-09-30 ENCOUNTER — Other Ambulatory Visit: Payer: Self-pay

## 2018-09-30 VITALS — BP 187/96 | HR 65 | Temp 97.3°F | Resp 18 | Ht 68.0 in | Wt 163.9 lb

## 2018-09-30 DIAGNOSIS — Z951 Presence of aortocoronary bypass graft: Secondary | ICD-10-CM

## 2018-09-30 NOTE — Progress Notes (Signed)
      [address]       [place] [address]             [phone]        Darren Mable Dobbins Medical Record #[id number] Date of Birth: [birth year]  Referring: Rossie Muskrat, DO Primary Care: Jacqualine Code, DO Primary Cardiologist:No primary care provider on file.  Reason for visit:   follow-up  History of Present Illness:     Doing well.  Has increased in lisinopril to 10mg  this past Monday.  Otherwise doing well  Physical Exam: BP (!) 187/96 (BP Location: Right Arm, Patient Position: Sitting, Cuff Size: Normal)   Pulse 65   Temp (!) 97.3 F (36.3 C)   Resp 18   Ht 5\' 8"  (1.727 m)   Wt 163 lb 14.4 oz (74.3 kg)   SpO2 98% Comment: RA  BMI 24.92 kg/m   Alert NAD Regular, no murmur.  Incision clean.  Sternum stable Abdomen soft, NT/ND no peripheral edema   Diagnostic Studies & Laboratory data: CXR: clear     Assessment / Plan:   S/p CABG.  Doing well Clear for cardiac rehab F/u prn   Lajuana Matte 09/30/2018 11:30 AM

## 2018-10-03 ENCOUNTER — Other Ambulatory Visit: Payer: Self-pay

## 2018-10-03 ENCOUNTER — Encounter (HOSPITAL_COMMUNITY): Payer: Medicare PPO

## 2018-10-03 ENCOUNTER — Encounter (HOSPITAL_COMMUNITY)
Admission: RE | Admit: 2018-10-03 | Discharge: 2018-10-03 | Disposition: A | Discharge status: Home / Self Care | Payer: Medicare PPO | Source: Ambulatory Visit | Attending: Cardiology | Admitting: Cardiology

## 2018-10-03 DIAGNOSIS — Z79899 Other long term (current) drug therapy: Secondary | ICD-10-CM | POA: Diagnosis not present

## 2018-10-03 DIAGNOSIS — Z951 Presence of aortocoronary bypass graft: Secondary | ICD-10-CM

## 2018-10-03 LAB — GLUCOSE, CAPILLARY
Glucose-Capillary: 109 mg/dL — ABNORMAL HIGH (ref 70–99)
Glucose-Capillary: 141 mg/dL — ABNORMAL HIGH (ref 70–99)

## 2018-10-03 NOTE — Progress Notes (Signed)
Daily Session Note  Patient Details  Name: Joseph Liu MRN: [id number] Date of Birth: 1950/09/01 Referring Provider:     [place] from 09/27/2018 in [place]  Referring Provider  Dr. Stanford Breed      Encounter Date: 10/03/2018  Check In: Session Check In - 10/03/18 1335      Check-In   Supervising physician immediately available to respond to emergencies  [facility] immediately available    Physician(s)  Dr. Marthenia Rolling    Location  [facility]    Staff Present  Jiles Garter, RN, Luisa Hart, RN, Deland Pretty, MS, ACSM CEP, Exercise Physiologist;Brittany Durene Fruits, BS, ACSM CEP, Exercise Physiologist    Virtual Visit  No    Medication changes reported      No    Fall or balance concerns reported     No    Tobacco Cessation  No Change    Warm-up and Cool-down  Performed on first and last piece of equipment    Resistance Training Performed  Yes    VAD Patient?  No    PAD/SET Patient?  No      Pain Assessment   Currently in Pain?  No/denies       Capillary Blood Glucose: Results for orders placed or performed during the hospital encounter of 10/03/18 (from the past 24 hour(s))  Glucose, capillary     Status: Abnormal   Collection Time: 10/03/18  1:26 PM  Result Value Ref Range   Glucose-Capillary 109 (H) 70 - 99 mg/dL  Glucose, capillary     Status: Abnormal   Collection Time: 10/03/18  2:23 PM  Result Value Ref Range   Glucose-Capillary 141 (H) 70 - 99 mg/dL      Social History   Tobacco Use  Smoking Status Never Smoker  Smokeless Tobacco Never Used    Goals Met:  Exercise tolerated well No report of cardiac concerns or symptoms Strength training completed today  Goals Unmet:  Not Applicable   Comments: Pt started cardiac rehab today.  Pt tolerated light exercise without difficulty. VSS, telemetry-NSR, asymptomatic.  Medication list reconciled. Pt denies barriers to  medicaiton compliance however he is concerned that his BP continues to remain "high" at home although his lisinopril has recently been increased by his PCP. Will notify Dr. Stanford Breed of medication changes and BP readings. PSYCHOSOCIAL ASSESSMENT:  PHQ-0. Pt exhibits positive coping skills, hopeful outlook with supportive family. No psychosocial needs identified at this time, no psychosocial interventions necessary. Pt oriented to exercise equipment and routine. Understanding verbalized.   Dr. Fransico Him is Medical Director for Cardiac Rehab at [facility].

## 2018-10-04 ENCOUNTER — Telehealth: Payer: Self-pay | Admitting: *Deleted

## 2018-10-04 DIAGNOSIS — I10 Essential (primary) hypertension: Secondary | ICD-10-CM

## 2018-10-04 MED ORDER — LISINOPRIL 20 MG PO TABS: 20.0000 mg | ORAL_TABLET | Freq: Every day | ORAL | 3 refills | Status: DC

## 2018-10-04 NOTE — Telephone Encounter (Signed)
Patient will need paperwork mailed to his son's house in [place], [place] [address]

## 2018-10-04 NOTE — Telephone Encounter (Signed)
Paperwork mailed to correct address.

## 2018-10-04 NOTE — Telephone Encounter (Addendum)
Spoke with pt, Aware of dr Jacalyn Lefevre recommendations. New script sent to the pharmacy and Lab orders mailed to the pt   ----- Message from Lelon Perla, MD sent at 10/04/2018  8:47 AM EDT ----- Regarding: FW: BP/ BP changes by PCP Increase lisinopril to 20 mg daily.  Check potassium and renal function in 1 week. Kirk Ruths ----- Message ----- From: Benedetto Goad, RN Sent: 10/04/2018   7:57 AM EDT To: Lelon Perla, MD Subject: BP/ BP changes by PCP                          Hi Dr. Stanford Breed,  FYI  Mr. Fluegge has been taking his BP at home and reports SBP 170s. He feels this is related to his Doxycycline prescribed by his PCP for 21 days. I requested his last PCP records and it looks like his lyme was positive. His PCP increased his lisinopril from 5mg  to 10 mg. Patient continues to report hypertension at home even with medication increase. He HAS NOT NOTIFIED HIS PCP. First day CR pressures as follows.  Pre exercise 158/82 During exercise 162/78, 176/74 Post exercise 162/84  PLEASE ADVISE AS NEEDED  Thank you, Portia E. Rollene Rotunda, RN

## 2018-10-05 ENCOUNTER — Other Ambulatory Visit: Payer: Self-pay

## 2018-10-05 ENCOUNTER — Encounter (HOSPITAL_COMMUNITY): Payer: Medicare PPO

## 2018-10-05 ENCOUNTER — Encounter (HOSPITAL_COMMUNITY)
Admission: RE | Admit: 2018-10-05 | Discharge: 2018-10-05 | Disposition: A | Discharge status: Home / Self Care | Payer: Medicare PPO | Source: Ambulatory Visit | Attending: Cardiology | Admitting: Cardiology

## 2018-10-05 DIAGNOSIS — Z951 Presence of aortocoronary bypass graft: Secondary | ICD-10-CM

## 2018-10-07 ENCOUNTER — Other Ambulatory Visit: Payer: Self-pay

## 2018-10-07 ENCOUNTER — Encounter (HOSPITAL_COMMUNITY)
Admission: RE | Admit: 2018-10-07 | Discharge: 2018-10-07 | Disposition: A | Discharge status: Home / Self Care | Payer: Medicare PPO | Source: Ambulatory Visit | Attending: Cardiology | Admitting: Cardiology

## 2018-10-07 ENCOUNTER — Encounter (HOSPITAL_COMMUNITY): Payer: Medicare PPO

## 2018-10-07 DIAGNOSIS — Z79899 Other long term (current) drug therapy: Secondary | ICD-10-CM | POA: Diagnosis not present

## 2018-10-07 DIAGNOSIS — Z951 Presence of aortocoronary bypass graft: Secondary | ICD-10-CM | POA: Insufficient documentation

## 2018-10-10 ENCOUNTER — Encounter (HOSPITAL_COMMUNITY): Payer: Medicare PPO

## 2018-10-10 ENCOUNTER — Other Ambulatory Visit: Payer: Self-pay

## 2018-10-10 ENCOUNTER — Encounter (HOSPITAL_COMMUNITY)
Admission: RE | Admit: 2018-10-10 | Discharge: 2018-10-10 | Disposition: A | Discharge status: Home / Self Care | Payer: Medicare PPO | Source: Ambulatory Visit | Attending: Cardiology | Admitting: Cardiology

## 2018-10-10 DIAGNOSIS — Z951 Presence of aortocoronary bypass graft: Secondary | ICD-10-CM

## 2018-10-11 ENCOUNTER — Other Ambulatory Visit: Payer: Self-pay | Admitting: Physician Assistant

## 2018-10-12 ENCOUNTER — Telehealth (HOSPITAL_COMMUNITY): Payer: Self-pay | Admitting: Family Medicine

## 2018-10-12 ENCOUNTER — Encounter (HOSPITAL_COMMUNITY): Payer: Medicare PPO

## 2018-10-12 LAB — BASIC METABOLIC PANEL
BUN/Creatinine Ratio: 14 (ref 10–24)
BUN: 21 mg/dL (ref 8–27)
CO2: 23 mmol/L (ref 20–29)
Calcium: 9.4 mg/dL (ref 8.6–10.2)
Chloride: 105 mmol/L (ref 96–106)
Creatinine, Ser: 1.45 mg/dL — ABNORMAL HIGH (ref 0.76–1.27)
GFR calc Af Amer: 58 mL/min/{1.73_m2} — ABNORMAL LOW (ref >59)
GFR calc non Af Amer: 50 mL/min/{1.73_m2} — ABNORMAL LOW (ref >59)
Glucose: 141 mg/dL — ABNORMAL HIGH (ref 65–99)
Potassium: 3.8 mmol/L (ref 3.5–5.2)
Sodium: 143 mmol/L (ref 134–144)

## 2018-10-13 ENCOUNTER — Encounter: Payer: Self-pay | Admitting: *Deleted

## 2018-10-14 ENCOUNTER — Telehealth (HOSPITAL_COMMUNITY): Payer: Self-pay | Admitting: Family Medicine

## 2018-10-14 ENCOUNTER — Encounter: Payer: Self-pay | Admitting: Internal Medicine

## 2018-10-14 ENCOUNTER — Encounter (HOSPITAL_COMMUNITY): Payer: Medicare PPO

## 2018-10-14 ENCOUNTER — Other Ambulatory Visit: Payer: Self-pay

## 2018-10-14 ENCOUNTER — Ambulatory Visit (INDEPENDENT_AMBULATORY_CARE_PROVIDER_SITE_OTHER): Payer: Medicare PPO | Admitting: Internal Medicine

## 2018-10-14 ENCOUNTER — Encounter (HOSPITAL_COMMUNITY)
Admission: RE | Admit: 2018-10-14 | Discharge: 2018-10-14 | Disposition: A | Discharge status: Home / Self Care | Payer: Medicare PPO | Source: Ambulatory Visit | Attending: Cardiology | Admitting: Cardiology

## 2018-10-14 VITALS — BP 162/82 | HR 71 | Temp 98.7°F | Ht 68.0 in | Wt 159.2 lb

## 2018-10-14 DIAGNOSIS — E1165 Type 2 diabetes mellitus with hyperglycemia: Secondary | ICD-10-CM

## 2018-10-14 DIAGNOSIS — E11649 Type 2 diabetes mellitus with hypoglycemia without coma: Secondary | ICD-10-CM

## 2018-10-14 DIAGNOSIS — Z794 Long term (current) use of insulin: Secondary | ICD-10-CM | POA: Diagnosis not present

## 2018-10-14 DIAGNOSIS — E1159 Type 2 diabetes mellitus with other circulatory complications: Secondary | ICD-10-CM | POA: Diagnosis not present

## 2018-10-14 DIAGNOSIS — Z951 Presence of aortocoronary bypass graft: Secondary | ICD-10-CM

## 2018-10-14 MED ORDER — NOVOLIN 70/30 FLEXPEN (70-30) 100 UNIT/ML ~~LOC~~ SUPN: PEN_INJECTOR | SUBCUTANEOUS | 6 refills | Status: DC

## 2018-10-14 NOTE — Progress Notes (Signed)
I have reviewed a Home Exercise Prescription with [name] . Joseph Liu is not currently exercising at home due to knee pain that he is having.  The patient was advised to walk 5-7 days a week for 8-10 minutes.  [name] and I discussed how to progress their exercise prescription.  The patient stated that their goals were to start walking short distances until his knee is 100%. Pt has seen his doctor about the knee pain. Pt stated his knee is feeling some better. The patient stated that they understand the exercise prescription.  We reviewed exercise guidelines, target heart rate during exercise, RPE Scale, weather conditions, NTG use, endpoints for exercise, warmup and cool down.  Patient is encouraged to come to me with any questions. I will continue to follow up with the patient to assist them with progression and safety.    Deitra Mayo BS, ACSM CEP 10/14/2018 3:38 PM

## 2018-10-14 NOTE — Progress Notes (Signed)
Name: Joseph Liu  Age/ Sex: 68 y.o., male   MRN/ DOB: [id number], 12/24/1950     PCP: Joaquin CourtsFavero, John Patrick, DO   Reason for Endocrinology Evaluation: Type 2 Diabetes Mellitus  Initial Endocrine Consultative Visit: 09/02/2018    PATIENT IDENTIFIER: [name] is a 68 y.o. 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 The patient has followed with Endocrinology clinic since 09/02/2018 for consultative assistance with management of his diabetes.  DIABETIC HISTORY:  [name] was diagnosed with T2DM ~ 2010.  He has been on Metformin and SU in the past, has been on insulin for years. His hemoglobin A1c 9.5% at presentation  ON his initial presentation he was on Glipizide XL and Novolin 70/30  SUBJECTIVE:   During the last visit (09/02/2018): We stopped Glipizide ,Decreased Novolin MIx dose  Today (10/14/2018): [name] is here for a 6 week follow up on diabetes management.  He checks his blood sugars 2-3 times daily, preprandial to breakfast and supper. The patient has not had hypoglycemic episodes since the last clinic visit. Otherwise, the patient has not required any recent emergency interventions for hypoglycemia and has not had recent hospitalizations secondary to hyper or hypoglycemic episodes.    ROS: As per HPI and as detailed below: Review of Systems  Constitutional: Negative for chills and fever.  HENT: Negative for congestion and sore throat.   Respiratory: Negative for cough and shortness of breath.   Cardiovascular: Negative for chest pain and palpitations.  Gastrointestinal: Negative for diarrhea and nausea.  Musculoskeletal: Positive for joint pain.      HOME DIABETES REGIMEN:  Novolim Mix 14 units BID    METER DOWNLOAD SUMMARY: Date range evaluated: 9/10-10/09/2018 Fingerstick Blood Glucose Tests = 67 Average Number Tests/Day = 2.2 Overall Mean FS Glucose = 141 Standard Deviation = 61.8  BG Ranges: Low = 72 High = 345    Hypoglycemic Events/30 Days: BG < 50 = 0 Episodes of symptomatic severe hypoglycemia = 0    HISTORY:  Past Medical History:  Past Medical History:  Diagnosis Date  . CAD (coronary artery disease)   . CKD (chronic kidney disease)   . DM (diabetes mellitus) (HCC)   . GERD (gastroesophageal reflux disease)   . Gout   . HTN (hypertension)   . Hyperlipidemia   . Hypothyroidism    Past Surgical History:  Past Surgical History:  Procedure Laterality Date  . BACK SURGERY    . CARDIAC CATHETERIZATION    . CORONARY ARTERY BYPASS GRAFT N/A 08/18/2018   Procedure: CORONARY ARTERY BYPASS GRAFTING (CABG) times three using left internal mammary artery and right saphenous vein. Aborted attempt left leg vein harvest.;  Surgeon: Corliss SkainsLightfoot, Harrell O, MD;  [facility];  Service: Open Heart Surgery;  Laterality: N/A;  . TEE WITHOUT CARDIOVERSION N/A 08/18/2018   Procedure: TRANSESOPHAGEAL ECHOCARDIOGRAM (TEE);  Surgeon: Corliss SkainsLightfoot, Harrell O, MD;  [facility];  Service: Open Heart Surgery;  Laterality: N/A;    Social History:  reports that he has never smoked. He has never used smokeless tobacco. He reports previous alcohol use. He reports that he does not use drugs. Family History:  Family History  Problem Relation Age of Onset  . Aneurysm Mother   . Diabetes Sister   . Pancreatic cancer Brother   . Diabetes Brother      HOME MEDICATIONS: Allergies as of 10/14/2018      Reactions   Codeine Nausea Only   Sudafed [  pseudoephedrine Hcl] Other (See Comments)   Renal problems      Medication List       Accurate as of October 14, 2018  7:59 AM. If you have any questions, ask your nurse or doctor.        STOP taking these medications   doxycycline 100 MG tablet Commonly known as: VIBRA-TABS Stopped by: Scarlette Shorts, MD     TAKE these medications   acetaminophen 500 MG tablet Commonly known as: TYLENOL Take 1,000 mg by mouth every 6 (six) hours as needed for mild  pain.   allopurinol 100 MG tablet Commonly known as: ZYLOPRIM Take 100 mg by mouth daily.   aspirin 325 MG EC tablet Take 1 tablet (325 mg total) by mouth daily.   atorvastatin 80 MG tablet Commonly known as: LIPITOR Take 1 tablet (80 mg total) by mouth daily.   cholecalciferol 25 MCG (1000 UT) tablet Commonly known as: VITAMIN D3 Take 1,000 Units by mouth daily.   levothyroxine 100 MCG tablet Commonly known as: SYNTHROID Take 100 mcg by mouth daily before breakfast.   lisinopril 20 MG tablet Commonly known as: ZESTRIL Take 1 tablet (20 mg total) by mouth daily.   metoprolol tartrate 50 MG tablet Commonly known as: LOPRESSOR Take 1 tablet (50 mg total) by mouth 2 (two) times daily.   NovoLIN 70/30 FlexPen (70-30) 100 UNIT/ML PEN Generic drug: Insulin Isophane & Regular Human Inject 14 Units into the skin daily before breakfast AND 12 Units daily before supper. What changed: See the new instructions. Changed by: Scarlette Shorts, MD   traMADol 50 MG tablet Commonly known as: ULTRAM Take 1 tablet (50 mg total) by mouth every 4 (four) hours as needed for moderate pain.        OBJECTIVE:   Vital Signs: BP (!) 162/82 (BP Location: Left Arm, Patient Position: Sitting, Cuff Size: Normal)   Pulse 71   Temp 98.7 F (37.1 C)   Ht 5\' 8"  (1.727 m)   Wt 159 lb 3.2 oz (72.2 kg)   SpO2 98%   BMI 24.21 kg/m   Wt Readings from Last 3 Encounters:  10/14/18 159 lb 3.2 oz (72.2 kg)  09/30/18 163 lb 14.4 oz (74.3 kg)  09/27/18 163 lb 5.8 oz (74.1 kg)     Exam: General: Pt appears well and is in NAD  Hydration: Well-hydrated with moist mucous membranes and good skin turgor  HEENT: Head: Unremarkable with good dentition. Oropharynx clear without exudate.  Eyes: External eye exam normal without stare, lid lag or exophthalmos.  EOM intact.  PERRL.  Neck: General: Supple without adenopathy. Thyroid: Thyroid size normal.  No goiter or nodules appreciated. No thyroid bruit.   Lungs: Clear with good BS bilat with no rales, rhonchi, or wheezes  Heart: RRR with normal S1 and S2 and no gallops; no murmurs; no rub  Abdomen: Normoactive bowel sounds, soft, nontender, without masses or organomegaly palpable  Extremities: No pretibial edema. No tremor. Normal strength and motion throughout. See detailed diabetic foot exam below.  Skin: Normal texture and temperature to palpation. No rash noted. No Acanthosis nigricans/skin tags. No lipohypertrophy.  Neuro: MS is good with appropriate affect, pt is alert and Ox3    DM foot exam     The skin of the feet is without sores or ulcerations.Toe nails thickened and discolored The pedal pulses are undetectable The sensation is intact to a screening 5.07, 10 gram monofilament bilaterally   DATA REVIEWED:  Lab  Results  Component Value Date   HGBA1C 9.5 (H) 08/17/2018   Lab Results  Component Value Date   LDLCALC 107 (H) 09/09/2018   CREATININE 1.45 (H) 10/12/2018    Lab Results  Component Value Date   CHOL 168 09/09/2018   HDL 40 09/09/2018   LDLCALC 107 (H) 09/09/2018   TRIG 115 09/09/2018   CHOLHDL 4.2 09/09/2018         ASSESSMENT / PLAN / RECOMMENDATIONS:   1) Type 2 Diabetes Mellitus, Poorly controlled, With Macrovascular complications and hypoglycemia unawareness   - His glycemic control have improved dramatically, praised the patient on lifestyle changes - He received an intra-articular injection last week which explains some of his high BG readings in 200 's. Other than that he was noted with tight BG's in the morning with BG's 70-80 mg/dL. Will adjust evening dose of insulin as below      MEDICATIONS:   Novolin Mix 14 units with Breakfast and 12 units with Supper  EDUCATION / INSTRUCTIONS:  BG monitoring instructions: Patient is instructed to check his blood sugars 2 times a day, before breakfast and supper.  Call [facility] if: BG persistently < 70 or > 300. . I reviewed  the Rule of 15 for the treatment of hypoglycemia in detail with the patient. Literature supplied   F/U in 3 months    Signed electronically by: Mack Guise, MD  [facility]  [facility] [address] [place], [place] [address] Phone: [phone] FAX: [phone]   CC: [address], [place] [address] Phone: [phone]  Fax: [phone]  Return to Endocrinology clinic as below: Future Appointments  Date Time Provider [place]  10/14/2018  1:15 PM [facility] PHASE II EXC [facility] None  10/17/2018  1:15 PM [facility] PHASE II EXC [facility] None  10/19/2018  1:15 PM [facility] PHASE II EXC [facility] None  10/21/2018  1:15 PM [facility] PHASE II EXC [facility] None  10/24/2018  1:15 PM [facility] PHASE II EXC [facility] None  10/26/2018  1:15 PM [facility] PHASE II EXC [facility] None  10/28/2018  1:15 PM [facility] PHASE II EXC [facility] None  10/31/2018  1:15 PM [facility] PHASE II EXC [facility] None  11/02/2018  1:15 PM [facility] PHASE II EXC [facility] None  11/04/2018  1:15 PM [facility] PHASE II EXC [facility] None  11/07/2018  1:15 PM [facility] PHASE II EXC [facility] None  11/09/2018  1:15 PM [facility] PHASE II EXC [facility] None  11/09/2018  2:40 PM Crenshaw, Denice Bors, MD [facility]  11/11/2018  1:15 PM [facility] PHASE II EXC [facility] None  11/14/2018  1:15 PM [facility] PHASE II EXC [facility] None  11/16/2018  1:15 PM [facility] PHASE II EXC [facility] None  11/18/2018  1:15 PM [facility] PHASE II EXC [facility] None  11/21/2018  1:15 PM [facility] PHASE II EXC [facility] None  11/23/2018  1:15 PM [facility] PHASE II EXC [facility] None  11/25/2018  1:15 PM [facility] PHASE II EXC [facility] None  12/12/2018 11:20 AM Crenshaw, Denice Bors, MD [facility]

## 2018-10-14 NOTE — Patient Instructions (Signed)
-   Novolin Mix  14 units Before your First meal of the day ( Breakfast ) - Novolin Mix 12 units before  Supper   - Check sugar before breakfast and supper and when you don't feel good    - HOW TO TREAT LOW BLOOD SUGARS (Blood sugar LESS THAN 70 MG/DL)  Please follow the RULE OF 15 for the treatment of hypoglycemia treatment (when your (blood sugars are less than 70 mg/dL)    STEP 1: Take 15 grams of carbohydrates when your blood sugar is low, which includes:   3-4 GLUCOSE TABS  OR  3-4 OZ OF JUICE OR REGULAR SODA OR  ONE TUBE OF GLUCOSE GEL     STEP 2: RECHECK blood sugar in 15 MINUTES STEP 3: If your blood sugar is still low at the 15 minute recheck --> then, go back to STEP 1 and treat AGAIN with another 15 grams of carbohydrates.

## 2018-10-17 ENCOUNTER — Encounter (HOSPITAL_COMMUNITY): Payer: Medicare PPO

## 2018-10-17 ENCOUNTER — Encounter (HOSPITAL_COMMUNITY)
Admission: RE | Admit: 2018-10-17 | Discharge: 2018-10-17 | Disposition: A | Discharge status: Home / Self Care | Payer: Medicare PPO | Source: Ambulatory Visit | Attending: Cardiology | Admitting: Cardiology

## 2018-10-17 ENCOUNTER — Other Ambulatory Visit: Payer: Self-pay

## 2018-10-17 DIAGNOSIS — Z951 Presence of aortocoronary bypass graft: Secondary | ICD-10-CM | POA: Diagnosis not present

## 2018-10-18 ENCOUNTER — Other Ambulatory Visit: Payer: Self-pay | Admitting: Cardiology

## 2018-10-19 ENCOUNTER — Encounter (HOSPITAL_COMMUNITY)
Admission: RE | Admit: 2018-10-19 | Discharge: 2018-10-19 | Disposition: A | Discharge status: Home / Self Care | Payer: Medicare PPO | Source: Ambulatory Visit | Attending: Cardiology | Admitting: Cardiology

## 2018-10-19 ENCOUNTER — Telehealth: Payer: Self-pay

## 2018-10-19 ENCOUNTER — Other Ambulatory Visit: Payer: Self-pay

## 2018-10-19 ENCOUNTER — Encounter (HOSPITAL_COMMUNITY): Payer: Medicare PPO

## 2018-10-19 DIAGNOSIS — Z951 Presence of aortocoronary bypass graft: Secondary | ICD-10-CM

## 2018-10-19 NOTE — Telephone Encounter (Signed)
Received a call from patient requesting recent bmet to be faxed to RA Dr.10/12/18 bmet faxed to fax # [phone].

## 2018-10-20 NOTE — Progress Notes (Signed)
Cardiac Individual Treatment Plan  Patient Details  Name: Joseph Liu MRN: [id number] Date of Birth: 1950-07-30 Referring Provider:     CARDIAC REHAB PHASE II ORIENTATION from 09/27/2018 in [place]  Referring Provider  Dr. 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  acetaminophen (TYLENOL) 500 MG tablet, Take 1,000 mg by mouth every 6 (six) hours as needed for mild pain., Disp: , Rfl:  .  allopurinol (ZYLOPRIM) 100 MG tablet, Take 100 mg by mouth daily., Disp: , Rfl:  .  aspirin EC 325 MG EC tablet, Take 1 tablet (325 mg total) by mouth daily., Disp: 30 tablet, Rfl: 0 .  atorvastatin (LIPITOR) 80 MG tablet, Take 1 tablet (80 mg total) by mouth daily., Disp: 90 tablet, Rfl: 3 .  cholecalciferol (VITAMIN D3) 25 MCG (1000 UT) tablet, Take 1,000 Units by mouth daily., Disp: , Rfl:  .  levothyroxine (SYNTHROID) 100 MCG tablet, Take 100 mcg by mouth daily before breakfast., Disp: , Rfl:  .  lisinopril (ZESTRIL) 20 MG tablet, Take 1 tablet (20 mg total) by mouth daily., Disp: 90 tablet, Rfl: 3 .  metoprolol tartrate (LOPRESSOR) 50 MG tablet, TAKE 1 TABLET BY MOUTH TWICE A DAY, Disp: 60 tablet, Rfl: 3 .  NOVOLIN 70/30 FLEXPEN (70-30) 100 UNIT/ML PEN, Inject 14 Units into the skin daily before breakfast AND 12 Units daily before supper., Disp: 15 mL, Rfl: 6 .  traMADol (ULTRAM) 50 MG tablet, Take 1 tablet (50 mg total) by mouth every 4 (four) hours as needed for moderate pain., Disp: 30 tablet, Rfl: 0  Past Medical History: Past Medical History:  Diagnosis Date  . CAD (coronary artery disease)   . CKD (chronic kidney disease)   . DM (diabetes mellitus) ([place])   . GERD (gastroesophageal reflux disease)   . Gout   . HTN  (hypertension)   . Hyperlipidemia   . Hypothyroidism     Tobacco Use: Social History   Tobacco Use  Smoking Status Never Smoker  Smokeless Tobacco Never Used    Labs: Recent Review Flowsheet Data    Labs for ITP Cardiac and Pulmonary Rehab Latest Ref Rng & Units 08/18/2018 08/18/2018 08/18/2018 08/18/2018 09/09/2018   Cholestrol 100 - 199 mg/dL - - - - 168   LDLCALC 0 - 99 mg/dL - - - - 107(H)   HDL >39 mg/dL - - - - 40   Trlycerides 0 - 149 mg/dL - - - - 115   Hemoglobin A1c 4.8 - 5.6 % - - - - -   PHART 7.350 - 7.450 7.430 7.331(L) - 7.360 -   PCO2ART 32.0 - 48.0 mmHg 28.7(L) 34.5 - 35.5 -   HCO3 20.0 - 28.0 mmol/L 19.3(L) 18.5(L) - 20.1 -   TCO2 22 - 32 mmol/L 20(L) 20(L) 19(L) 21(L) -   ACIDBASEDEF 0.0 - 2.0 mmol/L 5.0(H) 7.0(H) - 5.0(H) -   O2SAT % 99.0 98.0 - 99.0 -      Capillary Blood Glucose: Lab Results  Component Value Date   GLUCAP 141 (H) 10/03/2018   GLUCAP 109 (H) 10/03/2018   GLUCAP 214 (H) 08/23/2018   GLUCAP 200 (H) 08/23/2018   GLUCAP 121 (H) 08/23/2018     Exercise Target Goals:  Exercise Program Goal: Individual exercise prescription set using results from initial 6 min walk test and THRR while considering  patient's activity barriers and safety.   Exercise Prescription Goal: Starting with aerobic activity 30 plus minutes a day, 3 days per week for initial exercise prescription. Provide home exercise prescription and guidelines that participant acknowledges understanding prior to discharge.  Activity Barriers & Risk Stratification: Activity Barriers & Cardiac Risk Stratification - 09/27/18 1117      Activity Barriers & Cardiac Risk Stratification   Activity Barriers  Deconditioning;Muscular Weakness;Shortness of Breath;Assistive Device;Balance Concerns;Arthritis    Cardiac Risk Stratification  High       6 Minute Walk: 6 Minute Walk    Row Name 09/27/18 1116         6 Minute Walk   Phase  Initial     Distance  833 feet     Walk Time  6  minutes     # of Rest Breaks  0     MPH  1.5     METS  2.4     RPE  13     Perceived Dyspnea   1     VO2 Peak  8.63     Symptoms  Yes (comment)     Comments  SOB +1     Resting HR  69 bpm     Resting BP  170/80 Recheck: 134/84     Resting Oxygen Saturation   99 %     Exercise Oxygen Saturation  during 6 min walk  100 %     Max Ex. HR  81 bpm     Max Ex. BP  170/82     2 Minute Post BP  152/80        Oxygen Initial Assessment:   Oxygen Re-Evaluation:   Oxygen Discharge (Final Oxygen Re-Evaluation):   Initial Exercise Prescription: Initial Exercise Prescription - 09/27/18 1100      Date of Initial Exercise RX and Referring Provider   Date  09/27/18    Referring Provider  Dr. Stanford Breed    Expected Discharge Date  11/25/18      Recumbant Bike   Level  1.5    Watts  10    Minutes  15    METs  2.42      NuStep   Level  2    SPM  75    Minutes  15    METs  2      Prescription Details   Frequency (times per week)  3    Duration  Progress to 30 minutes of continuous aerobic without signs/symptoms of physical distress      Intensity   THRR 40-80% of Max Heartrate  62-123    Ratings of Perceived Exertion  11-13    Perceived Dyspnea  0-4      Progression   Progression  Continue to progress workloads to maintain intensity without signs/symptoms of physical distress.      Resistance Training   Training Prescription  Yes    Weight  3 lbs.     Reps  10-15       Perform Capillary Blood Glucose checks as needed.  Exercise Prescription Changes: Exercise Prescription Changes    Row Name 10/03/18 1500 10/14/18 1500 10/17/18 1315         Response to Exercise   Blood Pressure (Admit)  158/82  150/94  138/70     Blood Pressure (Exercise)  176/74  154/80  142/70  Blood Pressure (Exit)  162/84  168/80  124/80     Heart Rate (Admit)  90 bpm  72 bpm  85 bpm     Heart Rate (Exercise)  94 bpm  83 bpm  89 bpm     Heart Rate (Exit)  80 bpm  72 bpm  85 bpm      Rating of Perceived Exertion (Exercise)  _0 Symptoms  None  None  None     Comments  Pt first day of exercise.   -  -     Duration  Continue with 30 min of aerobic exercise without signs/symptoms of physical distress.  Continue with 30 min of aerobic exercise without signs/symptoms of physical distress.  Continue with 30 min of aerobic exercise without signs/symptoms of physical distress.     Intensity  THRR unchanged  THRR unchanged  THRR unchanged       Progression   Progression  Continue to progress workloads to maintain intensity without signs/symptoms of physical distress.  Continue to progress workloads to maintain intensity without signs/symptoms of physical distress.  Continue to progress workloads to maintain intensity without signs/symptoms of physical distress.     Average METs  1.7  2.2  2.1       Resistance Training   Training Prescription  Yes  Yes  Yes     Weight  3 lbs.   3 lbs.   3 lbs.      Reps  10-15  10-15  10-15     Time  10 Minutes  10 Minutes  10 Minutes       Interval Training   Interval Training  No  No  No       Recumbant Bike   Level  1  -  -     Watts  10  -  -     Minutes  15  -  -     METs  1.5  -  -       NuStep   Level  _1 SPM  75  75  75     Minutes  _2 METs  1.9  2.2  2.1       Home Exercise Plan   Plans to continue exercise at  -  Home (comment)  Home (comment)     Frequency  -  Add 3 additional days to program exercise sessions.  Add 3 additional days to program exercise sessions.     Initial Home Exercises Provided  -  10/14/18  10/14/18        Exercise Comments: Exercise Comments    Row Name 10/03/18 1524 10/14/18 1544 10/20/18 1106       Exercise Comments  Pt first day of exercise. Pt tolerated exercise well.  Reviewed HEP with Pt. Pt understands goals.  Reviewed METs and goals with Pt. Pt is progressing well.        Exercise Goals and Review: Exercise Goals    Row Name 09/27/18 1120              Exercise Goals   Increase Physical Activity  Yes       Intervention  Provide advice, education, support and counseling about physical activity/exercise needs.;Develop an individualized exercise prescription for aerobic and resistive training based on initial evaluation findings, risk stratification, comorbidities and participant's personal goals.  Expected Outcomes  Short Term: Attend rehab on a regular basis to increase amount of physical activity.;Long Term: Add in home exercise to make exercise part of routine and to increase amount of physical activity.;Long Term: Exercising regularly at least 3-5 days a week.       Increase Strength and Stamina  Yes       Intervention  Provide advice, education, support and counseling about physical activity/exercise needs.;Develop an individualized exercise prescription for aerobic and resistive training based on initial evaluation findings, risk stratification, comorbidities and participant's personal goals.       Expected Outcomes  Short Term: Increase workloads from initial exercise prescription for resistance, speed, and METs.;Short Term: Perform resistance training exercises routinely during rehab and add in resistance training at home;Long Term: Improve cardiorespiratory fitness, muscular endurance and strength as measured by increased METs and functional capacity (6MWT)       Able to understand and use rate of perceived exertion (RPE) scale  Yes       Intervention  Provide education and explanation on how to use RPE scale       Expected Outcomes  Short Term: Able to use RPE daily in rehab to express subjective intensity level;Long Term:  Able to use RPE to guide intensity level when exercising independently       Able to understand and use Dyspnea scale  Yes       Intervention  Provide education and explanation on how to use Dyspnea scale       Expected Outcomes  Short Term: Able to use Dyspnea scale daily in rehab to express subjective sense of  shortness of breath during exertion;Long Term: Able to use Dyspnea scale to guide intensity level when exercising independently       Knowledge and understanding of Target Heart Rate Range (THRR)  Yes       Intervention  Provide education and explanation of THRR including how the numbers were predicted and where they are located for reference       Expected Outcomes  Short Term: Able to state/look up THRR;Long Term: Able to use THRR to govern intensity when exercising independently;Short Term: Able to use daily as guideline for intensity in rehab       Able to check pulse independently  Yes       Intervention  Review the importance of being able to check your own pulse for safety during independent exercise;Provide education and demonstration on how to check pulse in carotid and radial arteries.       Expected Outcomes  Short Term: Able to explain why pulse checking is important during independent exercise;Long Term: Able to check pulse independently and accurately       Understanding of Exercise Prescription  Yes       Intervention  Provide education, explanation, and written materials on patient's individual exercise prescription       Expected Outcomes  Short Term: Able to explain program exercise prescription;Long Term: Able to explain home exercise prescription to exercise independently          Exercise Goals Re-Evaluation : Exercise Goals Re-Evaluation    Row Name 10/03/18 1523 10/14/18 1541 10/19/18 1315         Exercise Goal Re-Evaluation   Exercise Goals Review  Increase Physical Activity;Understanding of Exercise Prescription;Increase Strength and Stamina;Knowledge and understanding of Target Heart Rate Range (THRR);Able to understand and use rate of perceived exertion (RPE) scale  Increase Physical Activity;Understanding of Exercise Prescription;Increase Strength and Stamina;Knowledge and  understanding of Target Heart Rate Range (THRR);Able to understand and use rate of perceived  exertion (RPE) scale;Able to check pulse independently  Increase Physical Activity;Increase Strength and Stamina;Able to understand and use rate of perceived exertion (RPE) scale;Knowledge and understanding of Target Heart Rate Range (THRR);Able to check pulse independently;Understanding of Exercise Prescription     Comments  Pt first day of exercise in CR program. Pt tolerated exercise well and understadns THRR, RPE scale, and exercise Rx.  Reviewed HEP with Pt. Pt understands goals, THRR, RPE scale, pulse coutning, warm up and cool down stretches, end points of exercise, and weather precautions. Pt stated he not currently exercising at home due to knee pain he is having. Pt visited his doctor and is feeling some relief. Encouraged Pt to start walking 8-10 minutes 5-7 days per week until his knee is 100%.  Reviewed METs and goals with Pt. Pt is progressing well and has a MET level of 2.1. Pt has been having some knee problems and is only able to do the seated stepper for 30 minutes. Will resume rec bike when Pt knee is better. Pt stated he is walking at home for exercise 20-30 minutes per day 2-4 days in addition to CR program.     Expected Outcomes  Will continue to monitor and progress Pt as tolerated.  Will continue to monitor and progress Pt as tolerated.  Will continue to monitor and progress Pt as tolerated.         Discharge Exercise Prescription (Final Exercise Prescription Changes): Exercise Prescription Changes - 10/17/18 1315      Response to Exercise   Blood Pressure (Admit)  138/70    Blood Pressure (Exercise)  142/70    Blood Pressure (Exit)  124/80    Heart Rate (Admit)  85 bpm    Heart Rate (Exercise)  89 bpm    Heart Rate (Exit)  85 bpm    Rating of Perceived Exertion (Exercise)  11    Symptoms  None    Duration  Continue with 30 min of aerobic exercise without signs/symptoms of physical distress.    Intensity  THRR unchanged      Progression   Progression  Continue to  progress workloads to maintain intensity without signs/symptoms of physical distress.    Average METs  2.1      Resistance Training   Training Prescription  Yes    Weight  3 lbs.     Reps  10-15    Time  10 Minutes      Interval Training   Interval Training  No      NuStep   Level  2    SPM  75    Minutes  30    METs  2.1      Home Exercise Plan   Plans to continue exercise at  Home (comment)    Frequency  Add 3 additional days to program exercise sessions.    Initial Home Exercises Provided  10/14/18       Nutrition:  Target Goals: Understanding of nutrition guidelines, daily intake of sodium <1517m, cholesterol <2063m calories 30% from fat and 7% or less from saturated fats, daily to have 5 or more servings of fruits and vegetables.  Biometrics: Pre Biometrics - 09/27/18 1121      Pre Biometrics   Height  5' 7" (1.702 m)    Weight  74.1 kg    Waist Circumference  38 inches    Hip Circumference  38  inches    Waist to Hip Ratio  1 %    BMI (Calculated)  25.58    Triceps Skinfold  20 mm    % Body Fat  27.1 %    Grip Strength  19 kg    Flexibility  10 in    Single Leg Stand  3.68 seconds        Nutrition Therapy Plan and Nutrition Goals:   Nutrition Assessments:   Nutrition Goals Re-Evaluation:   Nutrition Goals Discharge (Final Nutrition Goals Re-Evaluation):   Psychosocial: Target Goals: Acknowledge presence or absence of significant depression and/or stress, maximize coping skills, provide positive support system. Participant is able to verbalize types and ability to use techniques and skills needed for reducing stress and depression.  Initial Review & Psychosocial Screening: Initial Psych Review & Screening - 09/27/18 1050      Initial Review   Current issues with  None Identified      Family Dynamics   Good Support System?  Yes    Comments  There are no psychosocial barriers to exercise in CR and health management noted at this time. Patient  has a positive attitude and utilizes healthy stress management. He states he has a healthy support system including family and friends.      Barriers   Psychosocial barriers to participate in program  There are no identifiable barriers or psychosocial needs.      Screening Interventions   Interventions  Encouraged to exercise       Quality of Life Scores: Quality of Life - 09/27/18 1122      Quality of Life   Select  Quality of Life      Quality of Life Scores   Health/Function Pre  19.6 %    Socioeconomic Pre  24 %    Psych/Spiritual Pre  22.29 %    Family Pre  25.2 %    GLOBAL Pre  21.88 %      Scores of 19 and below usually indicate a poorer quality of life in these areas.  A difference of  2-3 points is a clinically meaningful difference.  A difference of 2-3 points in the total score of the Quality of Life Index has been associated with significant improvement in overall quality of life, self-image, physical symptoms, and general health in studies assessing change in quality of life.  PHQ-9: Recent Review Flowsheet Data    Depression screen [facility] 2/9 09/27/2018   Decreased Interest 0   Down, Depressed, Hopeless 0   PHQ - 2 Score 0     Interpretation of Total Score  Total Score Depression Severity:  1-4 = Minimal depression, 5-9 = Mild depression, 10-14 = Moderate depression, 15-19 = Moderately severe depression, 20-27 = Severe depression   Psychosocial Evaluation and Intervention: Psychosocial Evaluation - 10/03/18 1506      Psychosocial Evaluation & Interventions   Interventions  Encouraged to exercise with the program and follow exercise prescription    Comments  Patient denies psychosocial barriers to exercise in CR at this time. Overall QOL score 21.88. He has a strong support system including family and friends. He is currently living with his son until he fully recovers from cardiac surgery and able to live independently. He enjoys fishing and can't wait until he is  able to fish again.    Expected Outcomes  Patient will continue to have a positive outlook. He will reach out for support from family, friends, and careteam if psychosocial barriers to self  health management should arise.    Continue Psychosocial Services   No Follow up required       Psychosocial Re-Evaluation: Psychosocial Re-Evaluation    [place] Name 10/19/18 1028             Psychosocial Re-Evaluation   Current issues with  None Identified       Comments  Patient continues to deny psychosocial barriers to self care management and participation in CR. He continues to live with his son during his recovery with hopes to return to independent living. He states his family and friends are very supportive and he continues to have a positive outlook.       Expected Outcomes  Patient will continue to maintain a positie outlook and attitude. He will utilize family, friends, and health care team for support if psychosocial barriers to participation in CR or self health management arise.       Interventions  Encouraged to attend Pulmonary Rehabilitation for the exercise       Continue Psychosocial Services   No Follow up required          Psychosocial Discharge (Final Psychosocial Re-Evaluation): Psychosocial Re-Evaluation - 10/19/18 1028      Psychosocial Re-Evaluation   Current issues with  None Identified    Comments  Patient continues to deny psychosocial barriers to self care management and participation in CR. He continues to live with his son during his recovery with hopes to return to independent living. He states his family and friends are very supportive and he continues to have a positive outlook.    Expected Outcomes  Patient will continue to maintain a positie outlook and attitude. He will utilize family, friends, and health care team for support if psychosocial barriers to participation in CR or self health management arise.    Interventions  Encouraged to attend Pulmonary  Rehabilitation for the exercise    Continue Psychosocial Services   No Follow up required       Vocational Rehabilitation: Provide vocational rehab assistance to qualifying candidates.   Vocational Rehab Evaluation & Intervention: Vocational Rehab - 09/27/18 1054      Initial Vocational Rehab Evaluation & Intervention   Assessment shows need for Vocational Rehabilitation  No       Education: Education Goals: Education classes will be provided on a weekly basis, covering required topics. Participant will state understanding/return demonstration of topics presented.  Learning Barriers/Preferences: Learning Barriers/Preferences - 09/27/18 1122      Learning Barriers/Preferences   Learning Barriers  None    Learning Preferences  Written Material       Education Topics: Hypertension, Hypertension Reduction -Define heart disease and high blood pressure. Discus how high blood pressure affects the body and ways to reduce high blood pressure.   Exercise and Your Heart -Discuss why it is important to exercise, the FITT principles of exercise, normal and abnormal responses to exercise, and how to exercise safely.   Angina -Discuss definition of angina, causes of angina, treatment of angina, and how to decrease risk of having angina.   Cardiac Medications -Review what the following cardiac medications are used for, how they affect the body, and side effects that may occur when taking the medications.  Medications include Aspirin, Beta blockers, calcium channel blockers, ACE Inhibitors, angiotensin receptor blockers, diuretics, digoxin, and antihyperlipidemics.   Congestive Heart Failure -Discuss the definition of CHF, how to live with CHF, the signs and symptoms of CHF, and how keep track of weight and sodium  intake.   Heart Disease and Intimacy -Discus the effect sexual activity has on the heart, how changes occur during intimacy as we age, and safety during sexual  activity.   Smoking Cessation / COPD -Discuss different methods to quit smoking, the health benefits of quitting smoking, and the definition of COPD.   Nutrition I: Fats -Discuss the types of cholesterol, what cholesterol does to the heart, and how cholesterol levels can be controlled.   Nutrition II: Labels -Discuss the different components of food labels and how to read food label   Heart Parts/Heart Disease and PAD -Discuss the anatomy of the heart, the pathway of blood circulation through the heart, and these are affected by heart disease.   Stress I: Signs and Symptoms -Discuss the causes of stress, how stress may lead to anxiety and depression, and ways to limit stress.   Stress II: Relaxation -Discuss different types of relaxation techniques to limit stress.   Warning Signs of Stroke / TIA -Discuss definition of a stroke, what the signs and symptoms are of a stroke, and how to identify when someone is having stroke.   Knowledge Questionnaire Score: Knowledge Questionnaire Score - 09/27/18 1122      Knowledge Questionnaire Score   Pre Score  15/24       Core Components/Risk Factors/Patient Goals at Admission: Personal Goals and Risk Factors at Admission - 09/27/18 1123      Core Components/Risk Factors/Patient Goals on Admission    Weight Management  Yes;Weight Maintenance    Intervention  Weight Management: Develop a combined nutrition and exercise program designed to reach desired caloric intake, while maintaining appropriate intake of nutrient and fiber, sodium and fats, and appropriate energy expenditure required for the weight goal.;Weight Management: Provide education and appropriate resources to help participant work on and attain dietary goals.    Admit Weight  163 lb 5.8 oz (74.1 kg)    Expected Outcomes  Short Term: Continue to assess and modify interventions until short term weight is achieved;Long Term: Adherence to nutrition and physical  activity/exercise program aimed toward attainment of established weight goal;Weight Maintenance: Understanding of the daily nutrition guidelines, which includes 25-35% calories from fat, 7% or less cal from saturated fats, less than 273m cholesterol, less than 1.5gm of sodium, & 5 or more servings of fruits and vegetables daily;Understanding recommendations for meals to include 15-35% energy as protein, 25-35% energy from fat, 35-60% energy from carbohydrates, less than 2045mof dietary cholesterol, 20-35 gm of total fiber daily;Understanding of distribution of calorie intake throughout the day with the consumption of 4-5 meals/snacks    Diabetes  Yes    Intervention  Provide education about signs/symptoms and action to take for hypo/hyperglycemia.;Provide education about proper nutrition, including hydration, and aerobic/resistive exercise prescription along with prescribed medications to achieve blood glucose in normal ranges: Fasting glucose 65-99 mg/dL    Expected Outcomes  Short Term: Participant verbalizes understanding of the signs/symptoms and immediate care of hyper/hypoglycemia, proper foot care and importance of medication, aerobic/resistive exercise and nutrition plan for blood glucose control.;Long Term: Attainment of HbA1C < 7%.    Hypertension  Yes    Intervention  Provide education on lifestyle modifcations including regular physical activity/exercise, weight management, moderate sodium restriction and increased consumption of fresh fruit, vegetables, and low fat dairy, alcohol moderation, and smoking cessation.;Monitor prescription use compliance.    Expected Outcomes  Short Term: Continued assessment and intervention until BP is < 140/9023mG in hypertensive participants. < 130/31m77m in hypertensive  participants with diabetes, heart failure or chronic kidney disease.;Long Term: Maintenance of blood pressure at goal levels.    Lipids  Yes    Intervention  Provide education and support for  participant on nutrition & aerobic/resistive exercise along with prescribed medications to achieve LDL <73m, HDL >453m    Expected Outcomes  Short Term: Participant states understanding of desired cholesterol values and is compliant with medications prescribed. Participant is following exercise prescription and nutrition guidelines.;Long Term: Cholesterol controlled with medications as prescribed, with individualized exercise RX and with personalized nutrition plan. Value goals: LDL < 7037mHDL > 40 mg.       Core Components/Risk Factors/Patient Goals Review:  Goals and Risk Factor Review    Row Name 10/03/18 1511 10/19/18 1035           Core Components/Risk Factors/Patient Goals Review   Personal Goals Review  Weight Management/Obesity;Lipids;Diabetes;Hypertension  Weight Management/Obesity;Lipids;Diabetes;Hypertension      Review  Patient with multiple CAD risk factors. He is eager to participate in CR to increase his stamina and strength and learn risk factor modification.  Patient with multiple CAD risk factors. He continues to be eager to participate in CR to increase his stamina and strength and learn risk factor modification. He admits to taking all medications as prescribed including his increased dose of antihypertensive which is doing well controlling his BP      Expected Outcomes  Patient will continue to participate in CR to modify risk factors and participate in lifestyle modifications. Patient's wishes to improve his stamina and strength and better his health.  Patient will continue to participate in CR to modify risk factors and participate in lifestyle modifications. Patient's wishes to improve his stamina and strength and better his health.         Core Components/Risk Factors/Patient Goals at Discharge (Final Review):  Goals and Risk Factor Review - 10/19/18 1035      Core Components/Risk Factors/Patient Goals Review   Personal Goals Review  Weight  Management/Obesity;Lipids;Diabetes;Hypertension    Review  Patient with multiple CAD risk factors. He continues to be eager to participate in CR to increase his stamina and strength and learn risk factor modification. He admits to taking all medications as prescribed including his increased dose of antihypertensive which is doing well controlling his BP    Expected Outcomes  Patient will continue to participate in CR to modify risk factors and participate in lifestyle modifications. Patient's wishes to improve his stamina and strength and better his health.       ITP Comments: ITP Comments    Row Name 09/27/18 1001 10/03/18 1506 10/19/18 1022       ITP Comments  Medical Director- Dr. TraFransico HimD  Patient started Cardic Rehab exercise sessions today and tolerated well  30 day ITP review: FraTracy doing well in cardiac rehab however he has missed 1 session since admission for left knee pain. He is unable to utilize the recumbant bike secondary to his knee pain. He has been referred to orthopedics who injected his knee with little relief. He is to follow up this week. Franks BP is much improved after an increase in his BP medications. HH remains stable. Patient denies cardiac symptoms.        Comments: See ITP comments

## 2018-10-21 ENCOUNTER — Encounter (HOSPITAL_COMMUNITY): Payer: Medicare PPO

## 2018-10-21 ENCOUNTER — Other Ambulatory Visit: Payer: Self-pay

## 2018-10-21 ENCOUNTER — Encounter (HOSPITAL_COMMUNITY)
Admission: RE | Admit: 2018-10-21 | Discharge: 2018-10-21 | Disposition: A | Discharge status: Home / Self Care | Payer: Medicare PPO | Source: Ambulatory Visit | Attending: Cardiology | Admitting: Cardiology

## 2018-10-21 ENCOUNTER — Ambulatory Visit: Payer: Medicare PPO | Admitting: Thoracic Surgery (Cardiothoracic Vascular Surgery)

## 2018-10-21 DIAGNOSIS — Z951 Presence of aortocoronary bypass graft: Secondary | ICD-10-CM | POA: Diagnosis not present

## 2018-10-21 NOTE — Progress Notes (Signed)
Joseph Liu's primary care physician added amlodipine 5 mg once a day. Joseph Liu's rheumatologist added prednisone 10 mg once a day, folic acid 1 mg once a day, methotrexate 2.5, 5 tablets once a week. Multivitamin once a day and iron once a day. Patient to bring iron dose in next week for verification.Harrell Gave RN BSN

## 2018-10-24 ENCOUNTER — Other Ambulatory Visit: Payer: Self-pay

## 2018-10-24 ENCOUNTER — Telehealth: Payer: Self-pay

## 2018-10-24 ENCOUNTER — Encounter (HOSPITAL_COMMUNITY)
Admission: RE | Admit: 2018-10-24 | Discharge: 2018-10-24 | Disposition: A | Discharge status: Home / Self Care | Payer: Medicare PPO | Source: Ambulatory Visit | Attending: Cardiology | Admitting: Cardiology

## 2018-10-24 ENCOUNTER — Encounter (HOSPITAL_COMMUNITY): Payer: Medicare PPO

## 2018-10-24 DIAGNOSIS — Z951 Presence of aortocoronary bypass graft: Secondary | ICD-10-CM

## 2018-10-24 NOTE — Telephone Encounter (Signed)
Does pt need to increase insulin while on Prednisone?

## 2018-10-24 NOTE — Telephone Encounter (Signed)
Since his blood sugars are going up with the prednisone in the morning he will increase the morning dose by 4 units on a routine basis as long as he is on prednisone He will also take additional 4 units at dinnertime if blood sugars are over 200

## 2018-10-24 NOTE — Telephone Encounter (Signed)
Called pt's son and gave him these new instructions. Pt's son did have a questions regarding his fathers blood sugars. Pt's son stated that last night after dinner, the pt's blood sugar was over 300, and he took 16 units of insulin. After waking up this am, his blood sugar was 129. Pt's son want to know if he should still follow the instructions that you gave or does it need to be changed because pt's son said it sounds like the pt is more sensitive to the insulin. He is worried about the pt having a low blood sugar if he gets 20 units of insulin for blood sugars over 200.

## 2018-10-24 NOTE — Telephone Encounter (Signed)
Patients son called in stating that doctor told them when he got new prescription to call in so the doctor cam adjust his insulin according to his new medication   Please call son and advise   Jovanny Stephanie  [phone]  ALL NEW MEDICINE HE IS TAKING   amLODipine (NORVASC) 5 MG tablet  folic acid (FOLVITE) 1 MG tablet  methotrexate (RHEUMATREX) 2.5 MG tablet predniSONE (DELTASONE) 10 MG tablet

## 2018-10-24 NOTE — Telephone Encounter (Signed)
He will increase the dose by 4 units twice a day when blood sugar starts exceeding 160 and 8 units if it goes up over 200.

## 2018-10-24 NOTE — Telephone Encounter (Signed)
Called pt's son and gave him MD message. Pt's son verbalized understanding 

## 2018-10-26 ENCOUNTER — Other Ambulatory Visit: Payer: Self-pay

## 2018-10-26 ENCOUNTER — Encounter (HOSPITAL_COMMUNITY): Payer: Medicare PPO

## 2018-10-26 ENCOUNTER — Encounter (HOSPITAL_COMMUNITY)
Admission: RE | Admit: 2018-10-26 | Discharge: 2018-10-26 | Disposition: A | Discharge status: Home / Self Care | Payer: Medicare PPO | Source: Ambulatory Visit | Attending: Cardiology | Admitting: Cardiology

## 2018-10-26 DIAGNOSIS — Z951 Presence of aortocoronary bypass graft: Secondary | ICD-10-CM | POA: Diagnosis not present

## 2018-10-26 NOTE — Progress Notes (Signed)
Nutrition Note  Spoke with pt today about diabetes management. Reviewed A1C and what that number means as well as target of <7%. Reviewed target fasting blood sugars and 2 hour post prandial. Pt poorly educated on nutrition for diabetes management. Provided education on macronutrients and carbohydrates impact on blood sugar. Discussed fiber rich carb choices and benefit of fiber on blood sugars.  Discussed the importance of creating a balanced meal with the addition of protein and non-starchy vegetables. Additionally discussed with patient that exercise may cause blood sugar to decrease and that we may need to add in a snack before or after workout, to manage any changes. Pt lives with son and son does most of the cooking. Pt has fast food for multiple meals each week. Provided suggestions for meals and snacks. Pt verbalized understanding of material discussed today. Distributed RD contact information.     Michaele Offer, MS, RDN, LDN

## 2018-10-28 ENCOUNTER — Encounter (HOSPITAL_COMMUNITY)
Admission: RE | Admit: 2018-10-28 | Discharge: 2018-10-28 | Disposition: A | Discharge status: Home / Self Care | Payer: Medicare PPO | Source: Ambulatory Visit | Attending: Cardiology | Admitting: Cardiology

## 2018-10-28 ENCOUNTER — Encounter (HOSPITAL_COMMUNITY): Payer: Medicare PPO

## 2018-10-28 ENCOUNTER — Other Ambulatory Visit: Payer: Self-pay

## 2018-10-28 DIAGNOSIS — Z951 Presence of aortocoronary bypass graft: Secondary | ICD-10-CM | POA: Diagnosis not present

## 2018-10-31 ENCOUNTER — Encounter (HOSPITAL_COMMUNITY): Payer: Medicare PPO

## 2018-10-31 ENCOUNTER — Other Ambulatory Visit: Payer: Self-pay

## 2018-10-31 ENCOUNTER — Encounter (HOSPITAL_COMMUNITY)
Admission: RE | Admit: 2018-10-31 | Discharge: 2018-10-31 | Disposition: A | Discharge status: Home / Self Care | Payer: Medicare PPO | Source: Ambulatory Visit | Attending: Cardiology | Admitting: Cardiology

## 2018-10-31 DIAGNOSIS — Z951 Presence of aortocoronary bypass graft: Secondary | ICD-10-CM

## 2018-11-02 ENCOUNTER — Other Ambulatory Visit: Payer: Self-pay

## 2018-11-02 ENCOUNTER — Encounter (HOSPITAL_COMMUNITY)
Admission: RE | Admit: 2018-11-02 | Discharge: 2018-11-02 | Disposition: A | Discharge status: Home / Self Care | Payer: Medicare PPO | Source: Ambulatory Visit | Attending: Cardiology | Admitting: Cardiology

## 2018-11-02 ENCOUNTER — Encounter (HOSPITAL_COMMUNITY): Payer: Medicare PPO

## 2018-11-02 DIAGNOSIS — Z951 Presence of aortocoronary bypass graft: Secondary | ICD-10-CM | POA: Diagnosis not present

## 2018-11-04 ENCOUNTER — Encounter (HOSPITAL_COMMUNITY): Payer: Medicare PPO

## 2018-11-04 ENCOUNTER — Encounter (HOSPITAL_COMMUNITY)
Admission: RE | Admit: 2018-11-04 | Discharge: 2018-11-04 | Disposition: A | Discharge status: Home / Self Care | Payer: Medicare PPO | Source: Ambulatory Visit | Attending: Cardiology | Admitting: Cardiology

## 2018-11-04 ENCOUNTER — Other Ambulatory Visit: Payer: Self-pay

## 2018-11-04 DIAGNOSIS — Z951 Presence of aortocoronary bypass graft: Secondary | ICD-10-CM | POA: Diagnosis not present

## 2018-11-07 ENCOUNTER — Encounter (HOSPITAL_COMMUNITY)
Admission: RE | Admit: 2018-11-07 | Discharge: 2018-11-07 | Disposition: A | Discharge status: Home / Self Care | Payer: Medicare PPO | Source: Ambulatory Visit | Attending: Cardiology | Admitting: Cardiology

## 2018-11-07 ENCOUNTER — Other Ambulatory Visit: Payer: Self-pay

## 2018-11-07 ENCOUNTER — Encounter (HOSPITAL_COMMUNITY): Payer: Medicare PPO

## 2018-11-07 DIAGNOSIS — Z951 Presence of aortocoronary bypass graft: Secondary | ICD-10-CM | POA: Insufficient documentation

## 2018-11-07 DIAGNOSIS — Z79899 Other long term (current) drug therapy: Secondary | ICD-10-CM | POA: Insufficient documentation

## 2018-11-09 ENCOUNTER — Encounter (HOSPITAL_COMMUNITY): Payer: Medicare PPO

## 2018-11-09 ENCOUNTER — Encounter (HOSPITAL_COMMUNITY)
Admission: RE | Admit: 2018-11-09 | Discharge: 2018-11-09 | Disposition: A | Discharge status: Home / Self Care | Payer: Medicare PPO | Source: Ambulatory Visit | Attending: Cardiology | Admitting: Cardiology

## 2018-11-09 ENCOUNTER — Encounter: Payer: Self-pay | Admitting: Cardiology

## 2018-11-09 ENCOUNTER — Ambulatory Visit (INDEPENDENT_AMBULATORY_CARE_PROVIDER_SITE_OTHER): Payer: Medicare PPO | Admitting: Cardiology

## 2018-11-09 ENCOUNTER — Other Ambulatory Visit: Payer: Self-pay

## 2018-11-09 VITALS — BP 164/86 | HR 62 | Ht 67.0 in | Wt 158.0 lb

## 2018-11-09 DIAGNOSIS — I10 Essential (primary) hypertension: Secondary | ICD-10-CM

## 2018-11-09 DIAGNOSIS — Z951 Presence of aortocoronary bypass graft: Secondary | ICD-10-CM

## 2018-11-09 DIAGNOSIS — I2510 Atherosclerotic heart disease of native coronary artery without angina pectoris: Secondary | ICD-10-CM

## 2018-11-09 DIAGNOSIS — E7800 Pure hypercholesterolemia, unspecified: Secondary | ICD-10-CM | POA: Diagnosis not present

## 2018-11-09 MED ORDER — ATORVASTATIN CALCIUM 80 MG PO TABS: 80.0000 mg | ORAL_TABLET | Freq: Every day | ORAL | 3 refills | Status: DC

## 2018-11-09 MED ORDER — AMLODIPINE BESYLATE 10 MG PO TABS: 10.0000 mg | ORAL_TABLET | Freq: Every day | ORAL | 3 refills | Status: DC

## 2018-11-09 MED ORDER — ATORVASTATIN CALCIUM 80 MG PO TABS: 80.0000 mg | ORAL_TABLET | Freq: Every day | ORAL | 3 refills | Status: AC

## 2018-11-09 MED ORDER — AMLODIPINE BESYLATE 10 MG PO TABS: 10.0000 mg | ORAL_TABLET | Freq: Every day | ORAL | 3 refills | Status: AC

## 2018-11-09 NOTE — Patient Instructions (Signed)
Medication Instructions:  INCREASE AMLODIPINE TO 10 MG ONCE DAILY= 2 OF THE 5 MG TABLETS ONCE DAILY  INCREASE ATORVASTATIN TO 80 MG ONCE DAILY= 4 OF THE 20 MG TABLETS ONCE DAILY  *If you need a refill on your cardiac medications before your next appointment, please call your pharmacy*  Lab Work: Your physician recommends that you return for lab work in: [place]  If you have labs (blood work) drawn today and your tests are completely normal, you will receive your results only by: Marland Kitchen MyChart Message (if you have MyChart) OR . A paper copy in the mail If you have any lab test that is abnormal or we need to change your treatment, we will call you to review the results.  Follow-Up: At [facility], you and your health needs are our priority.  As part of our continuing mission to provide you with exceptional heart care, we have created designated Provider Care Teams.  These Care Teams include your primary Cardiologist (physician) and Advanced Practice Providers (APPs -  Physician Assistants and Nurse Practitioners) who all work together to provide you with the care you need, when you need it.  Your next appointment:   6 months  The format for your next appointment:   Virtual Visit   Provider:   Kirk Ruths, MD

## 2018-11-09 NOTE — Progress Notes (Signed)
HPI: FU CAD.  Previously admitted to [facility] with nausea, vomiting and hypertensive urgency.  Also bradycardic. Had cath revealing LAD, diagonal and Lcx disease.  Patient was transferred to [facility] and underwent coronary artery bypass graft in August 2020 with a LIMA to the LAD, saphenous vein graft to the first and second diagonal.  Note preoperative carotid Doppler showed 1-39% bilateral stenosis.    Echocardiogram September 2020 showed normal LV function, mild left ventricular hypertrophy, grade 1 diastolic dysfunction, mild left atrial enlargement, mild to moderate mitral regurgitation, trace aortic insufficiency.  Since last seen, he denies dyspnea, chest pain, palpitations or syncope.  Current Outpatient Medications  Medication Sig Dispense Refill  . acetaminophen (TYLENOL) 500 MG tablet Take 1,000 mg by mouth every 6 (six) hours as needed for mild pain.    Marland Kitchen allopurinol (ZYLOPRIM) 100 MG tablet Take 100 mg by mouth daily.    Marland Kitchen amLODipine (NORVASC) 5 MG tablet Take 5 mg by mouth daily.    Marland Kitchen aspirin EC 325 MG EC tablet Take 1 tablet (325 mg total) by mouth daily. 30 tablet 0  . atorvastatin (LIPITOR) 20 MG tablet 20 mg 2 (two) times daily.    . cholecalciferol (VITAMIN D3) 25 MCG (1000 UT) tablet Take 1,000 Units by mouth daily.    . folic acid (FOLVITE) 1 MG tablet Take 1 mg by mouth daily.    Marland Kitchen levothyroxine (SYNTHROID) 100 MCG tablet Take 100 mcg by mouth daily before breakfast.    . lisinopril (ZESTRIL) 20 MG tablet Take 1 tablet (20 mg total) by mouth daily. 90 tablet 3  . methotrexate (RHEUMATREX) 2.5 MG tablet Take 2.5 mg by mouth once a week. Caution:Chemotherapy. Protect from light. 5 tablets at the end of the week    . metoprolol tartrate (LOPRESSOR) 50 MG tablet TAKE 1 TABLET BY MOUTH TWICE A DAY 60 tablet 3  . Multiple Vitamins-Minerals (MULTIVITAMIN ADULTS 50+ PO) Take by mouth daily.    Marland Kitchen NOVOLIN 70/30 FLEXPEN (70-30) 100 UNIT/ML PEN Inject 14 Units  into the skin daily before breakfast AND 12 Units daily before supper. (Patient taking differently: Inject 16Units into the skin daily before breakfast AND 16 Units daily before supper.) 15 mL 6  . predniSONE (DELTASONE) 10 MG tablet Take 10 mg by mouth daily with breakfast.    . traMADol (ULTRAM) 50 MG tablet Take 1 tablet (50 mg total) by mouth every 4 (four) hours as needed for moderate pain. 30 tablet 0   No current facility-administered medications for this visit.      Past Medical History:  Diagnosis Date  . CAD (coronary artery disease)   . CKD (chronic kidney disease)   . DM (diabetes mellitus) ([place])   . GERD (gastroesophageal reflux disease)   . Gout   . HTN (hypertension)   . Hyperlipidemia   . Hypothyroidism     Past Surgical History:  Procedure Laterality Date  . BACK SURGERY    . CARDIAC CATHETERIZATION    . CORONARY ARTERY BYPASS GRAFT N/A 08/18/2018   Procedure: CORONARY ARTERY BYPASS GRAFTING (CABG) times three using left internal mammary artery and right saphenous vein. Aborted attempt left leg vein harvest.;  Surgeon: Lajuana Matte, MD;  Location: [facility];  Service: Open Heart Surgery;  Laterality: N/A;  . TEE WITHOUT CARDIOVERSION N/A 08/18/2018   Procedure: TRANSESOPHAGEAL ECHOCARDIOGRAM (TEE);  Surgeon: Lajuana Matte, MD;  Location: [facility];  Service: Open Heart Surgery;  Laterality: N/A;  Social History   Socioeconomic History  . Marital status: Divorced    Spouse name: Not on file  . Number of children: 2  . Years of education: Not on file  . Highest education level: Not on file  Occupational History  . Occupation: retired  Engineer, production  . Financial resource strain: Not hard at all  . Food insecurity    Worry: Never true    Inability: Never true  . Transportation needs    Medical: No    Non-medical: No  Tobacco Use  . Smoking status: Never Smoker  . Smokeless tobacco: Never Used  Substance and Sexual Activity  . Alcohol use: Not  Currently  . Drug use: Never  . Sexual activity: Not on file  Lifestyle  . Physical activity    Days per week: 0 days    Minutes per session: Not on file  . Stress: Not at all  Relationships  . Social Musician on phone: Not on file    Gets together: Not on file    Attends religious service: Not on file    Active member of club or organization: Not on file    Attends meetings of clubs or organizations: Not on file    Relationship status: Not on file  . Intimate partner violence    Fear of current or ex partner: Not on file    Emotionally abused: Not on file    Physically abused: Not on file    Forced sexual activity: Not on file  Other Topics Concern  . Not on file  Social History Narrative   09/27/2018 Currently living with son in [place] while recovering from heart surgery    Family History  Problem Relation Age of Onset  . Aneurysm Mother   . Diabetes Sister   . Pancreatic cancer Brother   . Diabetes Brother     ROS: Some left shoulder pain but no fevers or chills, productive cough, hemoptysis, dysphasia, odynophagia, melena, hematochezia, dysuria, hematuria, rash, seizure activity, orthopnea, PND, pedal edema, claudication. Remaining systems are negative.  Physical Exam: Well-developed well-nourished in no acute distress.  Skin is warm and dry.  HEENT is normal.  Neck is supple.  Chest is clear to auscultation with normal expansion.  Cardiovascular exam is regular rate and rhythm.  Abdominal exam nontender or distended. No masses palpated. Extremities show no edema. neuro grossly intact  A/P  1 coronary artery disease status post coronary artery bypass graft-continue medical therapy with aspirin and statin.  2 history of ischemic cardiomyopathy-LV function normal on most recent echocardiogram.  Continue ACE inhibitor and beta-blocker.  3 hypertension-blood pressure elevated.  Increase amlodipine to 10 mg daily and follow.  4 hyperlipidemia-increase  Lipitor to 80 mg daily.  Check lipids and liver in 12 weeks.  5 chronic stage III kidney disease-followed by primary care.  6 mild to moderate mitral regurgitation-he will need follow-up echocardiograms in the future.  Olga Millers, MD

## 2018-11-09 NOTE — Addendum Note (Signed)
Addended by: Cristopher Estimable on: 11/09/2018 03:32 PM   Modules accepted: Orders

## 2018-11-11 ENCOUNTER — Encounter (HOSPITAL_COMMUNITY)
Admission: RE | Admit: 2018-11-11 | Discharge: 2018-11-11 | Disposition: A | Discharge status: Home / Self Care | Payer: Medicare PPO | Source: Ambulatory Visit | Attending: Cardiology | Admitting: Cardiology

## 2018-11-11 ENCOUNTER — Other Ambulatory Visit: Payer: Self-pay

## 2018-11-11 ENCOUNTER — Encounter (HOSPITAL_COMMUNITY): Payer: Medicare PPO

## 2018-11-11 DIAGNOSIS — Z951 Presence of aortocoronary bypass graft: Secondary | ICD-10-CM | POA: Diagnosis not present

## 2018-11-14 ENCOUNTER — Other Ambulatory Visit: Payer: Self-pay

## 2018-11-14 ENCOUNTER — Encounter (HOSPITAL_COMMUNITY)
Admission: RE | Admit: 2018-11-14 | Discharge: 2018-11-14 | Disposition: A | Discharge status: Home / Self Care | Payer: Medicare PPO | Source: Ambulatory Visit | Attending: Cardiology | Admitting: Cardiology

## 2018-11-14 DIAGNOSIS — Z951 Presence of aortocoronary bypass graft: Secondary | ICD-10-CM

## 2018-11-16 ENCOUNTER — Other Ambulatory Visit: Payer: Self-pay

## 2018-11-16 ENCOUNTER — Encounter (HOSPITAL_COMMUNITY)
Admission: RE | Admit: 2018-11-16 | Discharge: 2018-11-16 | Disposition: A | Discharge status: Home / Self Care | Payer: Medicare PPO | Source: Ambulatory Visit | Attending: Cardiology | Admitting: Cardiology

## 2018-11-16 DIAGNOSIS — Z951 Presence of aortocoronary bypass graft: Secondary | ICD-10-CM | POA: Diagnosis not present

## 2018-11-17 NOTE — Progress Notes (Signed)
Cardiac Individual Treatment Plan  Patient Details  Name: Kennan Detter MRN: [id number] Date of Birth: 11-22-1950 Referring Provider:     CARDIAC REHAB PHASE II ORIENTATION from 09/27/2018 in [facility]  Referring Provider  Dr. 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  acetaminophen (TYLENOL) 500 MG tablet, Take 1,000 mg by mouth every 6 (six) hours as needed for mild pain., Disp: , Rfl:  .  allopurinol (ZYLOPRIM) 100 MG tablet, Take 100 mg by mouth daily., Disp: , Rfl:  .  amLODipine (NORVASC) 10 MG tablet, Take 1 tablet (10 mg total) by mouth daily., Disp: 90 tablet, Rfl: 3 .  aspirin EC 325 MG EC tablet, Take 1 tablet (325 mg total) by mouth daily., Disp: 30 tablet, Rfl: 0 .  atorvastatin (LIPITOR) 80 MG tablet, Take 1 tablet (80 mg total) by mouth daily at 6 PM., Disp: 90 tablet, Rfl: 3 .  cholecalciferol (VITAMIN D3) 25 MCG (1000 UT) tablet, Take 1,000 Units by mouth daily., Disp: , Rfl:  .  folic acid (FOLVITE) 1 MG tablet, Take 1 mg by mouth daily., Disp: , Rfl:  .  levothyroxine (SYNTHROID) 100 MCG tablet, Take 100 mcg by mouth daily before breakfast., Disp: , Rfl:  .  lisinopril (ZESTRIL) 20 MG tablet, Take 1 tablet (20 mg total) by mouth daily., Disp: 90 tablet, Rfl: 3 .  methotrexate (RHEUMATREX) 2.5 MG tablet, Take 2.5 mg by mouth once a week. Caution:Chemotherapy. Protect from light. 5 tablets at the end of the week, Disp: , Rfl:  .  metoprolol tartrate (LOPRESSOR) 50 MG tablet, TAKE 1 TABLET BY MOUTH TWICE A DAY, Disp: 60 tablet, Rfl: 3 .  Multiple Vitamins-Minerals (MULTIVITAMIN ADULTS 50+ PO), Take by mouth daily., Disp: , Rfl:  .  NOVOLIN 70/30 FLEXPEN (70-30) 100 UNIT/ML PEN, Inject 14 Units into the skin  daily before breakfast AND 12 Units daily before supper. (Patient taking differently: Inject 16Units into the skin daily before breakfast AND 16 Units daily before supper.), Disp: 15 mL, Rfl: 6 .  predniSONE (DELTASONE) 10 MG tablet, Take 10 mg by mouth daily with breakfast., Disp: , Rfl:  .  traMADol (ULTRAM) 50 MG tablet, Take 1 tablet (50 mg total) by mouth every 4 (four) hours as needed for moderate pain., Disp: 30 tablet, Rfl: 0  Past Medical History: Past Medical History:  Diagnosis Date  . CAD (coronary artery disease)   . CKD (chronic kidney disease)   . DM (diabetes mellitus) ([place])   . GERD (gastroesophageal reflux disease)   . Gout   . HTN (hypertension)   . Hyperlipidemia   . Hypothyroidism     Tobacco Use: Social History   Tobacco Use  Smoking Status Never Smoker  Smokeless Tobacco Never Used    Labs: Recent Review Flowsheet Data    Labs for ITP Cardiac and Pulmonary Rehab Latest Ref Rng & Units 08/18/2018 08/18/2018 08/18/2018 08/18/2018 09/09/2018   Cholestrol 100 - 199 mg/dL - - - - 168   LDLCALC 0 - 99 mg/dL - - - - 107(H)   HDL >39 mg/dL - - - - 40   Trlycerides 0 - 149 mg/dL - - - - 115   Hemoglobin A1c 4.8 - 5.6 % - - - - -  PHART 7.350 - 7.450 7.430 7.331(L) - 7.360 -   PCO2ART 32.0 - 48.0 mmHg 28.7(L) 34.5 - 35.5 -   HCO3 20.0 - 28.0 mmol/L 19.3(L) 18.5(L) - 20.1 -   TCO2 22 - 32 mmol/L 20(L) 20(L) 19(L) 21(L) -   ACIDBASEDEF 0.0 - 2.0 mmol/L 5.0(H) 7.0(H) - 5.0(H) -   O2SAT % 99.0 98.0 - 99.0 -      Capillary Blood Glucose: Lab Results  Component Value Date   GLUCAP 141 (H) 10/03/2018   GLUCAP 109 (H) 10/03/2018   GLUCAP 214 (H) 08/23/2018   GLUCAP 200 (H) 08/23/2018   GLUCAP 121 (H) 08/23/2018     Exercise Target Goals: Exercise Program Goal: Individual exercise prescription set using results from initial 6 min walk test and THRR while considering  patient's activity barriers and safety.   Exercise Prescription Goal: Starting with aerobic  activity 30 plus minutes a day, 3 days per week for initial exercise prescription. Provide home exercise prescription and guidelines that participant acknowledges understanding prior to discharge.  Activity Barriers & Risk Stratification: Activity Barriers & Cardiac Risk Stratification - 09/27/18 1117      Activity Barriers & Cardiac Risk Stratification   Activity Barriers  Deconditioning;Muscular Weakness;Shortness of Breath;Assistive Device;Balance Concerns;Arthritis    Cardiac Risk Stratification  High       6 Minute Walk: 6 Minute Walk    Row Name 09/27/18 1116         6 Minute Walk   Phase  Initial     Distance  833 feet     Walk Time  6 minutes     # of Rest Breaks  0     MPH  1.5     METS  2.4     RPE  13     Perceived Dyspnea   1     VO2 Peak  8.63     Symptoms  Yes (comment)     Comments  SOB +1     Resting HR  69 bpm     Resting BP  170/80 Recheck: 134/84     Resting Oxygen Saturation   99 %     Exercise Oxygen Saturation  during 6 min walk  100 %     Max Ex. HR  81 bpm     Max Ex. BP  170/82     2 Minute Post BP  152/80        Oxygen Initial Assessment:   Oxygen Re-Evaluation:   Oxygen Discharge (Final Oxygen Re-Evaluation):   Initial Exercise Prescription: Initial Exercise Prescription - 09/27/18 1100      Date of Initial Exercise RX and Referring Provider   Date  09/27/18    Referring Provider  Dr. Stanford Breed    Expected Discharge Date  11/25/18      Recumbant Bike   Level  1.5    Watts  10    Minutes  15    METs  2.42      NuStep   Level  2    SPM  75    Minutes  15    METs  2      Prescription Details   Frequency (times per week)  3    Duration  Progress to 30 minutes of continuous aerobic without signs/symptoms of physical distress      Intensity   THRR 40-80% of Max Heartrate  62-123    Ratings of Perceived Exertion  11-13    Perceived Dyspnea  0-4      Progression   Progression  Continue to progress workloads to maintain  intensity without signs/symptoms of physical distress.      Resistance Training   Training Prescription  Yes    Weight  3 lbs.     Reps  10-15       Perform Capillary Blood Glucose checks as needed.  Exercise Prescription Changes: Exercise Prescription Changes    Row Name 10/03/18 1500 10/14/18 1500 10/17/18 1315 11/14/18 1315       Response to Exercise   Blood Pressure (Admit)  158/82  150/94  138/70  140/70    Blood Pressure (Exercise)  176/74  154/80  142/70  140/70    Blood Pressure (Exit)  162/84  168/80  124/80  110/62    Heart Rate (Admit)  90 bpm  72 bpm  85 bpm  73 bpm    Heart Rate (Exercise)  94 bpm  83 bpm  89 bpm  98 bpm    Heart Rate (Exit)  80 bpm  72 bpm  85 bpm  73 bpm    Rating of Perceived Exertion (Exercise)  '13  13  11  13    ' Symptoms  None  None  None  None    Comments  Pt first day of exercise.   -  -  -    Duration  Continue with 30 min of aerobic exercise without signs/symptoms of physical distress.  Continue with 30 min of aerobic exercise without signs/symptoms of physical distress.  Continue with 30 min of aerobic exercise without signs/symptoms of physical distress.  Continue with 30 min of aerobic exercise without signs/symptoms of physical distress.    Intensity  THRR unchanged  THRR unchanged  THRR unchanged  THRR unchanged      Progression   Progression  Continue to progress workloads to maintain intensity without signs/symptoms of physical distress.  Continue to progress workloads to maintain intensity without signs/symptoms of physical distress.  Continue to progress workloads to maintain intensity without signs/symptoms of physical distress.  Continue to progress workloads to maintain intensity without signs/symptoms of physical distress.    Average METs  1.7  2.2  2.1  3.2      Resistance Training   Training Prescription  Yes  Yes  Yes  Yes    Weight  3 lbs.   3 lbs.   3 lbs.   3 lbs.     Reps  10-15  10-15  10-15  10-15    Time  10 Minutes   10 Minutes  10 Minutes  10 Minutes      Interval Training   Interval Training  No  No  No  No      Recumbant Bike   Level  1  -  -  2    Watts  10  -  -  12    Minutes  15  -  -  15    METs  1.5  -  -  2.5      NuStep   Level  '2  2  2  4    ' SPM  75  75  75  85    Minutes  '15  30  30  15    ' METs  1.9  2.2  2.1  3.9      Home Exercise Plan   Plans to continue exercise at  -  Home (comment)  Home (comment)  Home (comment)  Frequency  -  Add 3 additional days to program exercise sessions.  Add 3 additional days to program exercise sessions.  Add 3 additional days to program exercise sessions.    Initial Home Exercises Provided  -  10/14/18  10/14/18  10/14/18       Exercise Comments: Exercise Comments    Row Name 10/03/18 1524 10/14/18 1544 10/20/18 1106 11/17/18 1308     Exercise Comments  Pt first day of exercise. Pt tolerated exercise well.  Reviewed HEP with Pt. Pt understands goals.  Reviewed METs and goals with Pt. Pt is progressing well.  Reviewed METs and goals with Pt. Pt is progressing well.       Exercise Goals and Review: Exercise Goals    Row Name 09/27/18 1120             Exercise Goals   Increase Physical Activity  Yes       Intervention  Provide advice, education, support and counseling about physical activity/exercise needs.;Develop an individualized exercise prescription for aerobic and resistive training based on initial evaluation findings, risk stratification, comorbidities and participant's personal goals.       Expected Outcomes  Short Term: Attend rehab on a regular basis to increase amount of physical activity.;Long Term: Add in home exercise to make exercise part of routine and to increase amount of physical activity.;Long Term: Exercising regularly at least 3-5 days a week.       Increase Strength and Stamina  Yes       Intervention  Provide advice, education, support and counseling about physical activity/exercise needs.;Develop an individualized  exercise prescription for aerobic and resistive training based on initial evaluation findings, risk stratification, comorbidities and participant's personal goals.       Expected Outcomes  Short Term: Increase workloads from initial exercise prescription for resistance, speed, and METs.;Short Term: Perform resistance training exercises routinely during rehab and add in resistance training at home;Long Term: Improve cardiorespiratory fitness, muscular endurance and strength as measured by increased METs and functional capacity (6MWT)       Able to understand and use rate of perceived exertion (RPE) scale  Yes       Intervention  Provide education and explanation on how to use RPE scale       Expected Outcomes  Short Term: Able to use RPE daily in rehab to express subjective intensity level;Long Term:  Able to use RPE to guide intensity level when exercising independently       Able to understand and use Dyspnea scale  Yes       Intervention  Provide education and explanation on how to use Dyspnea scale       Expected Outcomes  Short Term: Able to use Dyspnea scale daily in rehab to express subjective sense of shortness of breath during exertion;Long Term: Able to use Dyspnea scale to guide intensity level when exercising independently       Knowledge and understanding of Target Heart Rate Range (THRR)  Yes       Intervention  Provide education and explanation of THRR including how the numbers were predicted and where they are located for reference       Expected Outcomes  Short Term: Able to state/look up THRR;Long Term: Able to use THRR to govern intensity when exercising independently;Short Term: Able to use daily as guideline for intensity in rehab       Able to check pulse independently  Yes       Intervention  Review the importance of being able to check your own pulse for safety during independent exercise;Provide education and demonstration on how to check pulse in carotid and radial arteries.        Expected Outcomes  Short Term: Able to explain why pulse checking is important during independent exercise;Long Term: Able to check pulse independently and accurately       Understanding of Exercise Prescription  Yes       Intervention  Provide education, explanation, and written materials on patient's individual exercise prescription       Expected Outcomes  Short Term: Able to explain program exercise prescription;Long Term: Able to explain home exercise prescription to exercise independently          Exercise Goals Re-Evaluation : Exercise Goals Re-Evaluation    Row Name 10/03/18 1523 10/14/18 1541 10/19/18 1315 11/17/18 1304       Exercise Goal Re-Evaluation   Exercise Goals Review  Increase Physical Activity;Understanding of Exercise Prescription;Increase Strength and Stamina;Knowledge and understanding of Target Heart Rate Range (THRR);Able to understand and use rate of perceived exertion (RPE) scale  Increase Physical Activity;Understanding of Exercise Prescription;Increase Strength and Stamina;Knowledge and understanding of Target Heart Rate Range (THRR);Able to understand and use rate of perceived exertion (RPE) scale;Able to check pulse independently  Increase Physical Activity;Increase Strength and Stamina;Able to understand and use rate of perceived exertion (RPE) scale;Knowledge and understanding of Target Heart Rate Range (THRR);Able to check pulse independently;Understanding of Exercise Prescription  Increase Physical Activity;Increase Strength and Stamina;Able to understand and use rate of perceived exertion (RPE) scale;Knowledge and understanding of Target Heart Rate Range (THRR);Able to check pulse independently;Understanding of Exercise Prescription    Comments  Pt first day of exercise in CR program. Pt tolerated exercise well and understadns THRR, RPE scale, and exercise Rx.  Reviewed HEP with Pt. Pt understands goals, THRR, RPE scale, pulse coutning, warm up and cool down  stretches, end points of exercise, and weather precautions. Pt stated he not currently exercising at home due to knee pain he is having. Pt visited his doctor and is feeling some relief. Encouraged Pt to start walking 8-10 minutes 5-7 days per week until his knee is 100%.  Reviewed METs and goals with Pt. Pt is progressing well and has a MET level of 2.1. Pt has been having some knee problems and is only able to do the seated stepper for 30 minutes. Will resume rec bike when Pt knee is better. Pt stated he is walking at home for exercise 20-30 minutes per day 2-4 days in addition to CR program.  Reviewed METs and goals with Pt. Pt is progressing well and has a MET level of 3.2. Pt continues to increase workloads. Pt is not currently exercising at home but stated he would start walking on his treadmill 2-4 days for 20-30 minutes in addition to CR program.    Expected Outcomes  Will continue to monitor and progress Pt as tolerated.  Will continue to monitor and progress Pt as tolerated.  Will continue to monitor and progress Pt as tolerated.  Will continue to monitor and progress Pt as tolerated.        Discharge Exercise Prescription (Final Exercise Prescription Changes): Exercise Prescription Changes - 11/14/18 1315      Response to Exercise   Blood Pressure (Admit)  140/70    Blood Pressure (Exercise)  140/70    Blood Pressure (Exit)  110/62    Heart Rate (Admit)  73 bpm  Heart Rate (Exercise)  98 bpm    Heart Rate (Exit)  73 bpm    Rating of Perceived Exertion (Exercise)  13    Symptoms  None    Duration  Continue with 30 min of aerobic exercise without signs/symptoms of physical distress.    Intensity  THRR unchanged      Progression   Progression  Continue to progress workloads to maintain intensity without signs/symptoms of physical distress.    Average METs  3.2      Resistance Training   Training Prescription  Yes    Weight  3 lbs.     Reps  10-15    Time  10 Minutes       Interval Training   Interval Training  No      Recumbant Bike   Level  2    Watts  12    Minutes  15    METs  2.5      NuStep   Level  4    SPM  85    Minutes  15    METs  3.9      Home Exercise Plan   Plans to continue exercise at  Home (comment)    Frequency  Add 3 additional days to program exercise sessions.    Initial Home Exercises Provided  10/14/18       Nutrition:  Target Goals: Understanding of nutrition guidelines, daily intake of sodium <1557m, cholesterol <2048m calories 30% from fat and 7% or less from saturated fats, daily to have 5 or more servings of fruits and vegetables.  Biometrics: Pre Biometrics - 09/27/18 1121      Pre Biometrics   Height  '5\' 7"'  (1.702 m)    Weight  74.1 kg    Waist Circumference  38 inches    Hip Circumference  38 inches    Waist to Hip Ratio  1 %    BMI (Calculated)  25.58    Triceps Skinfold  20 mm    % Body Fat  27.1 %    Grip Strength  19 kg    Flexibility  10 in    Single Leg Stand  3.68 seconds        Nutrition Therapy Plan and Nutrition Goals:   Nutrition Assessments:   Nutrition Goals Re-Evaluation:   Nutrition Goals Discharge (Final Nutrition Goals Re-Evaluation):   Psychosocial: Target Goals: Acknowledge presence or absence of significant depression and/or stress, maximize coping skills, provide positive support system. Participant is able to verbalize types and ability to use techniques and skills needed for reducing stress and depression.  Initial Review & Psychosocial Screening: Initial Psych Review & Screening - 09/27/18 1050      Initial Review   Current issues with  None Identified      Family Dynamics   Good Support System?  Yes    Comments  There are no psychosocial barriers to exercise in CR and health management noted at this time. Patient has a positive attitude and utilizes healthy stress management. He states he has a healthy support system including family and friends.      Barriers    Psychosocial barriers to participate in program  There are no identifiable barriers or psychosocial needs.      Screening Interventions   Interventions  Encouraged to exercise       Quality of Life Scores: Quality of Life - 09/27/18 1122      Quality of Life   Select  Quality of Life      Quality of Life Scores   Health/Function Pre  19.6 %    Socioeconomic Pre  24 %    Psych/Spiritual Pre  22.29 %    Family Pre  25.2 %    GLOBAL Pre  21.88 %      Scores of 19 and below usually indicate a poorer quality of life in these areas.  A difference of  2-3 points is a clinically meaningful difference.  A difference of 2-3 points in the total score of the Quality of Life Index has been associated with significant improvement in overall quality of life, self-image, physical symptoms, and general health in studies assessing change in quality of life.  PHQ-9: Recent Review Flowsheet Data    Depression screen [facility] 2/9 09/27/2018   Decreased Interest 0   Down, Depressed, Hopeless 0   PHQ - 2 Score 0     Interpretation of Total Score  Total Score Depression Severity:  1-4 = Minimal depression, 5-9 = Mild depression, 10-14 = Moderate depression, 15-19 = Moderately severe depression, 20-27 = Severe depression   Psychosocial Evaluation and Intervention: Psychosocial Evaluation - 10/03/18 1506      Psychosocial Evaluation & Interventions   Interventions  Encouraged to exercise with the program and follow exercise prescription    Comments  Patient denies psychosocial barriers to exercise in CR at this time. Overall QOL score 21.88. He has a strong support system including family and friends. He is currently living with his son until he fully recovers from cardiac surgery and able to live independently. He enjoys fishing and can't wait until he is able to fish again.    Expected Outcomes  Patient will continue to have a positive outlook. He will reach out for support from family, friends, and  careteam if psychosocial barriers to self health management should arise.    Continue Psychosocial Services   No Follow up required       Psychosocial Re-Evaluation: Psychosocial Re-Evaluation    [place] Name 10/19/18 1028 11/15/18 0914           Psychosocial Re-Evaluation   Current issues with  None Identified  -      Comments  Patient continues to deny psychosocial barriers to self care management and participation in CR. He continues to live with his son during his recovery with hopes to return to independent living. He states his family and friends are very supportive and he continues to have a positive outlook.  Patient continues to deny psychosocial barriers to self care management and participation in CR. He continues to live with his son during his recovery with hopes to return to independent living. He states his family and friends are very supportive and he continues to have a positive outlook.      Expected Outcomes  Patient will continue to maintain a positie outlook and attitude. He will utilize family, friends, and health care team for support if psychosocial barriers to participation in CR or self health management arise.  Patient will continue to maintain a positie outlook and attitude. He will utilize family, friends, and health care team for support if psychosocial barriers to participation in CR or self health management arise.      Interventions  Encouraged to attend Pulmonary Rehabilitation for the exercise  Encouraged to attend Pulmonary Rehabilitation for the exercise      Continue Psychosocial Services   No Follow up required  No Follow up required  Psychosocial Discharge (Final Psychosocial Re-Evaluation): Psychosocial Re-Evaluation - 11/15/18 0914      Psychosocial Re-Evaluation   Comments  Patient continues to deny psychosocial barriers to self care management and participation in CR. He continues to live with his son during his recovery with hopes to return to  independent living. He states his family and friends are very supportive and he continues to have a positive outlook.    Expected Outcomes  Patient will continue to maintain a positie outlook and attitude. He will utilize family, friends, and health care team for support if psychosocial barriers to participation in CR or self health management arise.    Interventions  Encouraged to attend Pulmonary Rehabilitation for the exercise    Continue Psychosocial Services   No Follow up required       Vocational Rehabilitation: Provide vocational rehab assistance to qualifying candidates.   Vocational Rehab Evaluation & Intervention: Vocational Rehab - 09/27/18 1054      Initial Vocational Rehab Evaluation & Intervention   Assessment shows need for Vocational Rehabilitation  No       Education: Education Goals: Education classes will be provided on a weekly basis, covering required topics. Participant will state understanding/return demonstration of topics presented.  Learning Barriers/Preferences: Learning Barriers/Preferences - 09/27/18 1122      Learning Barriers/Preferences   Learning Barriers  None    Learning Preferences  Written Material       Education Topics: Hypertension, Hypertension Reduction -Define heart disease and high blood pressure. Discus how high blood pressure affects the body and ways to reduce high blood pressure.   Exercise and Your Heart -Discuss why it is important to exercise, the FITT principles of exercise, normal and abnormal responses to exercise, and how to exercise safely.   Angina -Discuss definition of angina, causes of angina, treatment of angina, and how to decrease risk of having angina.   Cardiac Medications -Review what the following cardiac medications are used for, how they affect the body, and side effects that may occur when taking the medications.  Medications include Aspirin, Beta blockers, calcium channel blockers, ACE Inhibitors,  angiotensin receptor blockers, diuretics, digoxin, and antihyperlipidemics.   Congestive Heart Failure -Discuss the definition of CHF, how to live with CHF, the signs and symptoms of CHF, and how keep track of weight and sodium intake.   Heart Disease and Intimacy -Discus the effect sexual activity has on the heart, how changes occur during intimacy as we age, and safety during sexual activity.   Smoking Cessation / COPD -Discuss different methods to quit smoking, the health benefits of quitting smoking, and the definition of COPD.   Nutrition I: Fats -Discuss the types of cholesterol, what cholesterol does to the heart, and how cholesterol levels can be controlled.   Nutrition II: Labels -Discuss the different components of food labels and how to read food label   Heart Parts/Heart Disease and PAD -Discuss the anatomy of the heart, the pathway of blood circulation through the heart, and these are affected by heart disease.   Stress I: Signs and Symptoms -Discuss the causes of stress, how stress may lead to anxiety and depression, and ways to limit stress.   Stress II: Relaxation -Discuss different types of relaxation techniques to limit stress.   Warning Signs of Stroke / TIA -Discuss definition of a stroke, what the signs and symptoms are of a stroke, and how to identify when someone is having stroke.   Knowledge Questionnaire Score: Knowledge Questionnaire Score - 09/27/18  1122      Knowledge Questionnaire Score   Pre Score  15/24       Core Components/Risk Factors/Patient Goals at Admission: Personal Goals and Risk Factors at Admission - 09/27/18 1123      Core Components/Risk Factors/Patient Goals on Admission    Weight Management  Yes;Weight Maintenance    Intervention  Weight Management: Develop a combined nutrition and exercise program designed to reach desired caloric intake, while maintaining appropriate intake of nutrient and fiber, sodium and fats, and  appropriate energy expenditure required for the weight goal.;Weight Management: Provide education and appropriate resources to help participant work on and attain dietary goals.    Admit Weight  163 lb 5.8 oz (74.1 kg)    Expected Outcomes  Short Term: Continue to assess and modify interventions until short term weight is achieved;Long Term: Adherence to nutrition and physical activity/exercise program aimed toward attainment of established weight goal;Weight Maintenance: Understanding of the daily nutrition guidelines, which includes 25-35% calories from fat, 7% or less cal from saturated fats, less than 259m cholesterol, less than 1.5gm of sodium, & 5 or more servings of fruits and vegetables daily;Understanding recommendations for meals to include 15-35% energy as protein, 25-35% energy from fat, 35-60% energy from carbohydrates, less than 2032mof dietary cholesterol, 20-35 gm of total fiber daily;Understanding of distribution of calorie intake throughout the day with the consumption of 4-5 meals/snacks    Diabetes  Yes    Intervention  Provide education about signs/symptoms and action to take for hypo/hyperglycemia.;Provide education about proper nutrition, including hydration, and aerobic/resistive exercise prescription along with prescribed medications to achieve blood glucose in normal ranges: Fasting glucose 65-99 mg/dL    Expected Outcomes  Short Term: Participant verbalizes understanding of the signs/symptoms and immediate care of hyper/hypoglycemia, proper foot care and importance of medication, aerobic/resistive exercise and nutrition plan for blood glucose control.;Long Term: Attainment of HbA1C < 7%.    Hypertension  Yes    Intervention  Provide education on lifestyle modifcations including regular physical activity/exercise, weight management, moderate sodium restriction and increased consumption of fresh fruit, vegetables, and low fat dairy, alcohol moderation, and smoking cessation.;Monitor  prescription use compliance.    Expected Outcomes  Short Term: Continued assessment and intervention until BP is < 140/9031mG in hypertensive participants. < 130/30m22m in hypertensive participants with diabetes, heart failure or chronic kidney disease.;Long Term: Maintenance of blood pressure at goal levels.    Lipids  Yes    Intervention  Provide education and support for participant on nutrition & aerobic/resistive exercise along with prescribed medications to achieve LDL <70mg13mL >40mg.39mExpected Outcomes  Short Term: Participant states understanding of desired cholesterol values and is compliant with medications prescribed. Participant is following exercise prescription and nutrition guidelines.;Long Term: Cholesterol controlled with medications as prescribed, with individualized exercise RX and with personalized nutrition plan. Value goals: LDL < 70mg, 25m> 40 mg.       Core Components/Risk Factors/Patient Goals Review:  Goals and Risk Factor Review    Row Name 10/03/18 1511 10/19/18 1035 11/15/18 0914         Core Components/Risk Factors/Patient Goals Review   Personal Goals Review  Weight Management/Obesity;Lipids;Diabetes;Hypertension  Weight Management/Obesity;Lipids;Diabetes;Hypertension  Weight Management/Obesity;Lipids;Diabetes;Hypertension     Review  Patient with multiple CAD risk factors. He is eager to participate in CR to increase his stamina and strength and learn risk factor modification.  Patient with multiple CAD risk factors. He continues to be eager to  participate in CR to increase his stamina and strength and learn risk factor modification. He admits to taking all medications as prescribed including his increased dose of antihypertensive which is doing well controlling his BP  Patient with multiple CAD risk factors. He continues to be eager to participate in CR to increase his stamina and strength and learn risk factor modification. He admits to taking all medications  as prescribed including his increased dose of antihypertensive which is doing well controlling his BP. He continue to monitor his vitals at home and is now participating in home exercise. He feels his stamina and strength are improving and is prepared to graduate in 1 week.     Expected Outcomes  Patient will continue to participate in CR to modify risk factors and participate in lifestyle modifications. Patient's wishes to improve his stamina and strength and better his health.  Patient will continue to participate in CR to modify risk factors and participate in lifestyle modifications. Patient's wishes to improve his stamina and strength and better his health.  Patient will continue to participate in CR to modify risk factors and participate in lifestyle modifications. Patient's wishes to improve his stamina and strength and better his health.        Core Components/Risk Factors/Patient Goals at Discharge (Final Review):  Goals and Risk Factor Review - 11/15/18 0914      Core Components/Risk Factors/Patient Goals Review   Personal Goals Review  Weight Management/Obesity;Lipids;Diabetes;Hypertension    Review  Patient with multiple CAD risk factors. He continues to be eager to participate in CR to increase his stamina and strength and learn risk factor modification. He admits to taking all medications as prescribed including his increased dose of antihypertensive which is doing well controlling his BP. He continue to monitor his vitals at home and is now participating in home exercise. He feels his stamina and strength are improving and is prepared to graduate in 1 week.    Expected Outcomes  Patient will continue to participate in CR to modify risk factors and participate in lifestyle modifications. Patient's wishes to improve his stamina and strength and better his health.       ITP Comments: ITP Comments    Row Name 09/27/18 1001 10/03/18 1506 10/19/18 1022 11/15/18 0911     ITP Comments   Medical Director- Dr. Fransico Him, MD  Patient started Cardic Rehab exercise sessions today and tolerated well  30 day ITP review: Demontez is doing well in cardiac rehab however he has missed 1 session since admission for left knee pain. He is unable to utilize the recumbant bike secondary to his knee pain. He has been referred to orthopedics who injected his knee with little relief. He is to follow up this week. Franks BP is much improved after an increase in his BP medications. HH remains stable. Patient denies cardiac symptoms.  30 day ITP review: Cross continues to do well in CR. He has a brief delay in workload increases secondary to RA complications however his arthritic symptoms has resolved with the addition of methotrexate. Franks BP is much improved after an increase in his BP medications. HR remains stable. Patient denies cardiac symptoms. He is adherent to all medical appointment.       Comments: see ITP comment

## 2018-11-18 ENCOUNTER — Encounter (HOSPITAL_COMMUNITY)
Admission: RE | Admit: 2018-11-18 | Discharge: 2018-11-18 | Disposition: A | Discharge status: Home / Self Care | Payer: Medicare PPO | Source: Ambulatory Visit | Attending: Cardiology | Admitting: Cardiology

## 2018-11-18 ENCOUNTER — Other Ambulatory Visit: Payer: Self-pay

## 2018-11-18 DIAGNOSIS — Z951 Presence of aortocoronary bypass graft: Secondary | ICD-10-CM

## 2018-11-21 ENCOUNTER — Other Ambulatory Visit: Payer: Self-pay

## 2018-11-21 ENCOUNTER — Encounter (HOSPITAL_COMMUNITY)
Admission: RE | Admit: 2018-11-21 | Discharge: 2018-11-21 | Disposition: A | Discharge status: Home / Self Care | Payer: Medicare PPO | Source: Ambulatory Visit | Attending: Cardiology | Admitting: Cardiology

## 2018-11-21 VITALS — Ht 67.0 in | Wt 157.6 lb

## 2018-11-21 DIAGNOSIS — Z951 Presence of aortocoronary bypass graft: Secondary | ICD-10-CM | POA: Diagnosis not present

## 2018-11-23 ENCOUNTER — Encounter (HOSPITAL_COMMUNITY)
Admission: RE | Admit: 2018-11-23 | Discharge: 2018-11-23 | Disposition: A | Discharge status: Home / Self Care | Payer: Medicare PPO | Source: Ambulatory Visit | Attending: Cardiology | Admitting: Cardiology

## 2018-11-23 ENCOUNTER — Other Ambulatory Visit: Payer: Self-pay

## 2018-11-23 DIAGNOSIS — Z951 Presence of aortocoronary bypass graft: Secondary | ICD-10-CM | POA: Diagnosis not present

## 2018-11-25 ENCOUNTER — Encounter (HOSPITAL_COMMUNITY)
Admission: RE | Admit: 2018-11-25 | Discharge: 2018-11-25 | Disposition: A | Discharge status: Home / Self Care | Payer: Medicare PPO | Source: Ambulatory Visit | Attending: Cardiology | Admitting: Cardiology

## 2018-11-25 ENCOUNTER — Other Ambulatory Visit: Payer: Self-pay

## 2018-11-25 DIAGNOSIS — Z951 Presence of aortocoronary bypass graft: Secondary | ICD-10-CM

## 2018-11-25 NOTE — Progress Notes (Signed)
Discharge Progress Report  Patient Details  Name: Joseph Liu MRN: [id number] Date of Birth: 1950-04-25 Referring Provider:     CARDIAC REHAB PHASE II ORIENTATION from 09/27/2018 in [facility]  Referring Provider  Dr. Stanford Breed       Number of Visits: 23  Reason for Discharge:  Patient reached a stable level of exercise. Patient independent in their exercise. Patient has met program and personal goals.  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 Stanford Breed    Expected Discharge Date  11/25/18      Recumbant Bike   Level  1.5    Watts  10    Minutes  15    METs  2.42      NuStep   Level  2    SPM  75    Minutes  15    METs  2      Prescription Details   Frequency (times per week)  3    Duration  Progress to 30 minutes of continuous aerobic without signs/symptoms of physical distress      Intensity   THRR 40-80% of Max Heartrate  62-123    Ratings of Perceived Exertion  11-13    Perceived Dyspnea  0-4      Progression   Progression  Continue to progress workloads to maintain intensity without signs/symptoms of physical distress.      Resistance Training   Training Prescription  Yes    Weight  3 lbs.     Reps  10-15       Discharge Exercise Prescription (Final Exercise Prescription Changes): Exercise Prescription Changes - 11/14/18 1315      Response to Exercise   Blood Pressure (Admit)  140/70    Blood Pressure (Exercise)  140/70    Blood Pressure (Exit)  110/62    Heart Rate (Admit)  73 bpm    Heart Rate (Exercise)  98 bpm    Heart Rate (Exit)  73 bpm    Rating of Perceived Exertion (Exercise)  13    Symptoms  None     Duration  Continue with 30 min of aerobic exercise without signs/symptoms of physical distress.    Intensity  THRR unchanged      Progression   Progression  Continue to progress workloads to maintain intensity without signs/symptoms of physical distress.    Average METs  3.2      Resistance Training   Training Prescription  Yes    Weight  3 lbs.     Reps  10-15    Time  10 Minutes      Interval Training   Interval Training  No      Recumbant Bike   Level  2    Watts  12    Minutes  15    METs  2.5      NuStep   Level  4    SPM  85    Minutes  15    METs  3.9      Home Exercise Plan   Plans to continue exercise at  Home (comment)    Frequency  Add  3 additional days to program exercise sessions.    Initial Home Exercises Provided  10/14/18       Functional Capacity: 6 Minute Walk    Row Name 09/27/18 1116 11/21/18 1635       6 Minute Walk   Phase  Initial  Discharge    Distance  833 feet  1428 feet    Distance % Change  -  71.43 %    Distance Feet Change  -  595 ft    Walk Time  6 minutes  6 minutes    # of Rest Breaks  0  0    MPH  1.5  2.7    METS  2.4  3.7    RPE  13  12    Perceived Dyspnea   1  0    VO2 Peak  8.63  12.9    Symptoms  Yes (comment)  No    Comments  SOB +1  None    Resting HR  69 bpm  69 bpm    Resting BP  170/80 Recheck: 134/84  140/70    Resting Oxygen Saturation   99 %  -    Exercise Oxygen Saturation  during 6 min walk  100 %  -    Max Ex. HR  81 bpm  96 bpm    Max Ex. BP  170/82  170/80    2 Minute Post BP  152/80  110/68       Psychological, QOL, Others - Outcomes: PHQ 2/9: Depression screen [facility] 2/9 11/25/2018 09/27/2018  Decreased Interest 0 0  Down, Depressed, Hopeless 0 0  PHQ - 2 Score 0 0    Quality of Life: Quality of Life - 11/23/18 1516      Quality of Life   Select  Quality of Life      Quality of Life Scores   Health/Function Post  25.1 %    Socioeconomic Post  26 %    Psych/Spiritual Post  28.29 %     Family Post  27.3 %    GLOBAL Post  26.37 %       Personal Goals: Goals established at orientation with interventions provided to work toward goal. Personal Goals and Risk Factors at Admission - 09/27/18 1123      Core Components/Risk Factors/Patient Goals on Admission    Weight Management  Yes;Weight Maintenance    Intervention  Weight Management: Develop a combined nutrition and exercise program designed to reach desired caloric intake, while maintaining appropriate intake of nutrient and fiber, sodium and fats, and appropriate energy expenditure required for the weight goal.;Weight Management: Provide education and appropriate resources to help participant work on and attain dietary goals.    Admit Weight  163 lb 5.8 oz (74.1 kg)    Expected Outcomes  Short Term: Continue to assess and modify interventions until short term weight is achieved;Long Term: Adherence to nutrition and physical activity/exercise program aimed toward attainment of established weight goal;Weight Maintenance: Understanding of the daily nutrition guidelines, which includes 25-35% calories from fat, 7% or less cal from saturated fats, less than 223m cholesterol, less than 1.5gm of sodium, & 5 or more servings of fruits and vegetables daily;Understanding recommendations for meals to include 15-35% energy as protein, 25-35% energy from fat, 35-60% energy from carbohydrates, less than 2067mof dietary cholesterol, 20-35 gm of total fiber daily;Understanding of distribution of calorie intake throughout the day with the consumption of 4-5 meals/snacks    Diabetes  Yes    Intervention  Provide education about signs/symptoms and action to take for hypo/hyperglycemia.;Provide education about proper nutrition, including hydration, and aerobic/resistive exercise prescription along with prescribed medications to achieve blood glucose in normal ranges: Fasting glucose 65-99 mg/dL    Expected Outcomes  Short Term: Participant verbalizes  understanding of the signs/symptoms and immediate care of hyper/hypoglycemia, proper foot care and importance of medication, aerobic/resistive exercise and nutrition plan for blood glucose control.;Long Term: Attainment of HbA1C < 7%.    Hypertension  Yes    Intervention  Provide education on lifestyle modifcations including regular physical activity/exercise, weight management, moderate sodium restriction and increased consumption of fresh fruit, vegetables, and low fat dairy, alcohol moderation, and smoking cessation.;Monitor prescription use compliance.    Expected Outcomes  Short Term: Continued assessment and intervention until BP is < 140/61m HG in hypertensive participants. < 130/836mHG in hypertensive participants with diabetes, heart failure or chronic kidney disease.;Long Term: Maintenance of blood pressure at goal levels.    Lipids  Yes    Intervention  Provide education and support for participant on nutrition & aerobic/resistive exercise along with prescribed medications to achieve LDL <7039mHDL >67m58m  Expected Outcomes  Short Term: Participant states understanding of desired cholesterol values and is compliant with medications prescribed. Participant is following exercise prescription and nutrition guidelines.;Long Term: Cholesterol controlled with medications as prescribed, with individualized exercise RX and with personalized nutrition plan. Value goals: LDL < 70mg72mL > 40 mg.        Personal Goals Discharge: Goals and Risk Factor Review    Row Name 10/03/18 1511 10/19/18 1035 11/15/18 0914 11/25/18 1544       Core Components/Risk Factors/Patient Goals Review   Personal Goals Review  Weight Management/Obesity;Lipids;Diabetes;Hypertension  Weight Management/Obesity;Lipids;Diabetes;Hypertension  Weight Management/Obesity;Lipids;Diabetes;Hypertension  Weight Management/Obesity;Lipids;Diabetes;Hypertension    Review  Patient with multiple CAD risk factors. He is eager to  participate in CR to increase his stamina and strength and learn risk factor modification.  Patient with multiple CAD risk factors. He continues to be eager to participate in CR to increase his stamina and strength and learn risk factor modification. He admits to taking all medications as prescribed including his increased dose of antihypertensive which is doing well controlling his BP  Patient with multiple CAD risk factors. He continues to be eager to participate in CR to increase his stamina and strength and learn risk factor modification. He admits to taking all medications as prescribed including his increased dose of antihypertensive which is doing well controlling his BP. He continue to monitor his vitals at home and is now participating in home exercise. He feels his stamina and strength are improving and is prepared to graduate in 1 week.  Patient with multiple CAD risk factors. He continues to be eager to modify his risk factors through healthy living including exercising, taking his medications as prescribed, adhering to a heart healthy diabetic diet, and attending all medical appointments. He continue to monitor his vitals and blood sugars at home and is now participating in home exercise.    Expected Outcomes  Patient will continue to participate in CR to modify risk factors and participate in lifestyle modifications. Patient's wishes to improve his stamina and strength and better his health.  Patient will continue to participate in CR to modify risk factors and participate in lifestyle modifications. Patient's wishes to improve his stamina and strength and better his health.  Patient will continue to participate in CR to modify risk  factors and participate in lifestyle modifications. Patient's wishes to improve his stamina and strength and better his health.  Patient will continue lifestyle modifications to reduce his CAD risk factors.       Exercise Goals and Review: Exercise Goals    Row Name  09/27/18 1120             Exercise Goals   Increase Physical Activity  Yes       Intervention  Provide advice, education, support and counseling about physical activity/exercise needs.;Develop an individualized exercise prescription for aerobic and resistive training based on initial evaluation findings, risk stratification, comorbidities and participant's personal goals.       Expected Outcomes  Short Term: Attend rehab on a regular basis to increase amount of physical activity.;Long Term: Add in home exercise to make exercise part of routine and to increase amount of physical activity.;Long Term: Exercising regularly at least 3-5 days a week.       Increase Strength and Stamina  Yes       Intervention  Provide advice, education, support and counseling about physical activity/exercise needs.;Develop an individualized exercise prescription for aerobic and resistive training based on initial evaluation findings, risk stratification, comorbidities and participant's personal goals.       Expected Outcomes  Short Term: Increase workloads from initial exercise prescription for resistance, speed, and METs.;Short Term: Perform resistance training exercises routinely during rehab and add in resistance training at home;Long Term: Improve cardiorespiratory fitness, muscular endurance and strength as measured by increased METs and functional capacity (6MWT)       Able to understand and use rate of perceived exertion (RPE) scale  Yes       Intervention  Provide education and explanation on how to use RPE scale       Expected Outcomes  Short Term: Able to use RPE daily in rehab to express subjective intensity level;Long Term:  Able to use RPE to guide intensity level when exercising independently       Able to understand and use Dyspnea scale  Yes       Intervention  Provide education and explanation on how to use Dyspnea scale       Expected Outcomes  Short Term: Able to use Dyspnea scale daily in rehab to  express subjective sense of shortness of breath during exertion;Long Term: Able to use Dyspnea scale to guide intensity level when exercising independently       Knowledge and understanding of Target Heart Rate Range (THRR)  Yes       Intervention  Provide education and explanation of THRR including how the numbers were predicted and where they are located for reference       Expected Outcomes  Short Term: Able to state/look up THRR;Long Term: Able to use THRR to govern intensity when exercising independently;Short Term: Able to use daily as guideline for intensity in rehab       Able to check pulse independently  Yes       Intervention  Review the importance of being able to check your own pulse for safety during independent exercise;Provide education and demonstration on how to check pulse in carotid and radial arteries.       Expected Outcomes  Short Term: Able to explain why pulse checking is important during independent exercise;Long Term: Able to check pulse independently and accurately       Understanding of Exercise Prescription  Yes       Intervention  Provide education, explanation, and  written materials on patient's individual exercise prescription       Expected Outcomes  Short Term: Able to explain program exercise prescription;Long Term: Able to explain home exercise prescription to exercise independently          Exercise Goals Re-Evaluation: Exercise Goals Re-Evaluation    Row Name 10/03/18 1523 10/14/18 1541 10/19/18 1315 11/17/18 1304       Exercise Goal Re-Evaluation   Exercise Goals Review  Increase Physical Activity;Understanding of Exercise Prescription;Increase Strength and Stamina;Knowledge and understanding of Target Heart Rate Range (THRR);Able to understand and use rate of perceived exertion (RPE) scale  Increase Physical Activity;Understanding of Exercise Prescription;Increase Strength and Stamina;Knowledge and understanding of Target Heart Rate Range (THRR);Able to  understand and use rate of perceived exertion (RPE) scale;Able to check pulse independently  Increase Physical Activity;Increase Strength and Stamina;Able to understand and use rate of perceived exertion (RPE) scale;Knowledge and understanding of Target Heart Rate Range (THRR);Able to check pulse independently;Understanding of Exercise Prescription  Increase Physical Activity;Increase Strength and Stamina;Able to understand and use rate of perceived exertion (RPE) scale;Knowledge and understanding of Target Heart Rate Range (THRR);Able to check pulse independently;Understanding of Exercise Prescription    Comments  Pt first day of exercise in CR program. Pt tolerated exercise well and understadns THRR, RPE scale, and exercise Rx.  Reviewed HEP with Pt. Pt understands goals, THRR, RPE scale, pulse coutning, warm up and cool down stretches, end points of exercise, and weather precautions. Pt stated he not currently exercising at home due to knee pain he is having. Pt visited his doctor and is feeling some relief. Encouraged Pt to start walking 8-10 minutes 5-7 days per week until his knee is 100%.  Reviewed METs and goals with Pt. Pt is progressing well and has a MET level of 2.1. Pt has been having some knee problems and is only able to do the seated stepper for 30 minutes. Will resume rec bike when Pt knee is better. Pt stated he is walking at home for exercise 20-30 minutes per day 2-4 days in addition to CR program.  Reviewed METs and goals with Pt. Pt is progressing well and has a MET level of 3.2. Pt continues to increase workloads. Pt is not currently exercising at home but stated he would start walking on his treadmill 2-4 days for 20-30 minutes in addition to CR program.    Expected Outcomes  Will continue to monitor and progress Pt as tolerated.  Will continue to monitor and progress Pt as tolerated.  Will continue to monitor and progress Pt as tolerated.  Will continue to monitor and progress Pt as  tolerated.       Nutrition & Weight - Outcomes: Pre Biometrics - 09/27/18 1121      Pre Biometrics   Height  5' 7" (1.702 m)    Weight  74.1 kg    Waist Circumference  38 inches    Hip Circumference  38 inches    Waist to Hip Ratio  1 %    BMI (Calculated)  25.58    Triceps Skinfold  20 mm    % Body Fat  27.1 %    Grip Strength  19 kg    Flexibility  10 in    Single Leg Stand  3.68 seconds      Post Biometrics - 11/21/18 1636       Post  Biometrics   Height  5' 7" (1.702 m)    Weight  71.5 kg  Waist Circumference  37 inches    Hip Circumference  37 inches    Waist to Hip Ratio  1 %    BMI (Calculated)  24.68    Triceps Skinfold  12 mm    % Body Fat  24.2 %    Grip Strength  28 kg    Flexibility  15.5 in    Single Leg Stand  7.37 seconds       Nutrition:   Nutrition Discharge:   Education Questionnaire Score: Knowledge Questionnaire Score - 11/23/18 1516      Knowledge Questionnaire Score   Post Score  22/24      Pt graduated from cardiac rehab program today with completion of 23 exercise sessions in Phase II. Pt maintained good attendance and progressed nicely during his participation in rehab as evidenced by increased MET level. Medication list reconciled. Repeat  PHQ score-0.  Pt has made significant lifestyle changes and should be commended for his success. Pt feels he has achieved his goals during cardiac rehab.  Goals reviewed with patient; copy given to patient.

## 2018-12-05 NOTE — Progress Notes (Deleted)
HPI: FU CAD.Previously admitted to [facility] with nausea, vomiting and hypertensive urgency. Also bradycardic. Had cath revealing LAD, diagonal and Lcx disease.Patient was transferred to [facility] and underwent coronary artery bypass graft in August 2020 with a LIMA to the LAD, saphenous vein graft to thefirst and second diagonal. Note preoperative carotid Doppler showed 1-39% bilateral stenosis.  Echocardiogram September 2020 showed normal LV function, mild left ventricular hypertrophy, grade 1 diastolic dysfunction, mild left atrial enlargement, mild to moderate mitral regurgitation, trace aortic insufficiency.  Since last seen,   Current Outpatient Medications  Medication Sig Dispense Refill  . acetaminophen (TYLENOL) 500 MG tablet Take 1,000 mg by mouth every 6 (six) hours as needed for mild pain.    Marland Kitchen allopurinol (ZYLOPRIM) 100 MG tablet Take 100 mg by mouth daily.    Marland Kitchen amLODipine (NORVASC) 10 MG tablet Take 1 tablet (10 mg total) by mouth daily. 90 tablet 3  . aspirin EC 325 MG EC tablet Take 1 tablet (325 mg total) by mouth daily. 30 tablet 0  . atorvastatin (LIPITOR) 80 MG tablet Take 1 tablet (80 mg total) by mouth daily at 6 PM. 90 tablet 3  . cholecalciferol (VITAMIN D3) 25 MCG (1000 UT) tablet Take 1,000 Units by mouth daily.    . folic acid (FOLVITE) 1 MG tablet Take 1 mg by mouth daily.    Marland Kitchen levothyroxine (SYNTHROID) 100 MCG tablet Take 100 mcg by mouth daily before breakfast.    . methotrexate (RHEUMATREX) 2.5 MG tablet Take 2.5 mg by mouth once a week. Caution:Chemotherapy. Protect from light. 5 tablets at the end of the week    . metoprolol tartrate (LOPRESSOR) 50 MG tablet TAKE 1 TABLET BY MOUTH TWICE A DAY 60 tablet 3  . Multiple Vitamins-Minerals (MULTIVITAMIN ADULTS 50+ PO) Take by mouth daily.    Marland Kitchen NOVOLIN 70/30 FLEXPEN (70-30) 100 UNIT/ML PEN Inject 14 Units into the skin daily before breakfast AND 12 Units daily before supper. (Patient taking  differently: Inject 18 Units into the skin daily before breakfast AND 16 Units daily before supper.) 15 mL 6  . olmesartan (BENICAR) 40 MG tablet Take 40 mg by mouth daily.    . predniSONE (DELTASONE) 10 MG tablet Take 10 mg by mouth daily with breakfast.    . traMADol (ULTRAM) 50 MG tablet Take 1 tablet (50 mg total) by mouth every 4 (four) hours as needed for moderate pain. (Patient not taking: Reported on 11/25/2018) 30 tablet 0   No current facility-administered medications for this visit.      Past Medical History:  Diagnosis Date  . CAD (coronary artery disease)   . CKD (chronic kidney disease)   . DM (diabetes mellitus) ([place])   . GERD (gastroesophageal reflux disease)   . Gout   . HTN (hypertension)   . Hyperlipidemia   . Hypothyroidism     Past Surgical History:  Procedure Laterality Date  . BACK SURGERY    . CARDIAC CATHETERIZATION    . CORONARY ARTERY BYPASS GRAFT N/A 08/18/2018   Procedure: CORONARY ARTERY BYPASS GRAFTING (CABG) times three using left internal mammary artery and right saphenous vein. Aborted attempt left leg vein harvest.;  Surgeon: Lajuana Matte, MD;  Location: [facility];  Service: Open Heart Surgery;  Laterality: N/A;  . TEE WITHOUT CARDIOVERSION N/A 08/18/2018   Procedure: TRANSESOPHAGEAL ECHOCARDIOGRAM (TEE);  Surgeon: Lajuana Matte, MD;  Location: [facility];  Service: Open Heart Surgery;  Laterality: N/A;  Social History   Socioeconomic History  . Marital status: Divorced    Spouse name: Not on file  . Number of children: 2  . Years of education: Not on file  . Highest education level: Not on file  Occupational History  . Occupation: retired  Engineer, production  . Financial resource strain: Not hard at all  . Food insecurity    Worry: Never true    Inability: Never true  . Transportation needs    Medical: No    Non-medical: No  Tobacco Use  . Smoking status: Never Smoker  . Smokeless tobacco: Never Used  Substance and Sexual  Activity  . Alcohol use: Not Currently  . Drug use: Never  . Sexual activity: Not on file  Lifestyle  . Physical activity    Days per week: 0 days    Minutes per session: Not on file  . Stress: Not at all  Relationships  . Social Musician on phone: Not on file    Gets together: Not on file    Attends religious service: Not on file    Active member of club or organization: Not on file    Attends meetings of clubs or organizations: Not on file    Relationship status: Not on file  . Intimate partner violence    Fear of current or ex partner: Not on file    Emotionally abused: Not on file    Physically abused: Not on file    Forced sexual activity: Not on file  Other Topics Concern  . Not on file  Social History Narrative   09/27/2018 Currently living with son in [place] while recovering from heart surgery    Family History  Problem Relation Age of Onset  . Aneurysm Mother   . Diabetes Sister   . Pancreatic cancer Brother   . Diabetes Brother     ROS: no fevers or chills, productive cough, hemoptysis, dysphasia, odynophagia, melena, hematochezia, dysuria, hematuria, rash, seizure activity, orthopnea, PND, pedal edema, claudication. Remaining systems are negative.  Physical Exam: Well-developed well-nourished in no acute distress.  Skin is warm and dry.  HEENT is normal.  Neck is supple.  Chest is clear to auscultation with normal expansion.  Cardiovascular exam is regular rate and rhythm.  Abdominal exam nontender or distended. No masses palpated. Extremities show no edema. neuro grossly intact  ECG- personally reviewed  A/P  1 status post coronary artery bypass and graft-patient denies chest pain.  Continue medical therapy with aspirin and statin.  2 ischemic cardiomyopathy-LV function has normalized on most recent echocardiogram.  Continue beta-blocker and ACE inhibitor.  3 hypertension-patient's blood pressure is controlled.  Continue present medications  and follow.  4 hyperlipidemia-continue Lipitor 80 mg daily.  5 chronic stage III kidney disease-managed by primary care.  6 mild to moderate mitral regurgitation-he will need follow-up echoes in the future.  Olga Millers, MD

## 2018-12-12 ENCOUNTER — Ambulatory Visit: Payer: Medicare PPO | Admitting: Cardiology

## 2019-01-18 ENCOUNTER — Ambulatory Visit: Payer: Medicare PPO | Admitting: Internal Medicine

## 2019-02-24 ENCOUNTER — Other Ambulatory Visit: Payer: Self-pay

## 2019-02-28 ENCOUNTER — Other Ambulatory Visit: Payer: Self-pay

## 2019-02-28 ENCOUNTER — Ambulatory Visit (INDEPENDENT_AMBULATORY_CARE_PROVIDER_SITE_OTHER): Payer: Medicare PPO | Admitting: Internal Medicine

## 2019-02-28 ENCOUNTER — Encounter: Payer: Self-pay | Admitting: Internal Medicine

## 2019-02-28 DIAGNOSIS — Z794 Long term (current) use of insulin: Secondary | ICD-10-CM

## 2019-02-28 DIAGNOSIS — E1159 Type 2 diabetes mellitus with other circulatory complications: Secondary | ICD-10-CM

## 2019-02-28 MED ORDER — NOVOLIN 70/30 FLEXPEN (70-30) 100 UNIT/ML ~~LOC~~ SUPN: PEN_INJECTOR | SUBCUTANEOUS | 4 refills | Status: AC

## 2019-02-28 NOTE — Progress Notes (Signed)
Virtual Visit via Video Note  I connected with Joseph Liu 02/23/21at 2:20 pm by a video enabled telemedicine application and verified that I am speaking with the correct person using two identifiers.   I discussed the limitations of evaluation and management by telemedicine and the availability of in person appointments. The patient expressed understanding and agreed to proceed.   -Location of the patient :Home  -Location of the provider : Office -The names of all persons participating in the telemedicine service : Pt , son and myself         Name: Joseph Liu  Age/ Sex: 69 y.o., male   MRN/ DOB: [id number], Jan 13, 1950     PCP: Joseph Code, DO   Reason for Endocrinology Evaluation: Type 2 Diabetes Mellitus  Initial Endocrine Consultative Visit: 09/02/2018    PATIENT IDENTIFIER: Joseph Liu is a 69 y.o. 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 The patient has followed with Endocrinology clinic since 09/02/2018 for consultative assistance with management of his diabetes.  DIABETIC HISTORY:  Joseph Liu was diagnosed with T2DM ~ 2010.  He has been on Metformin and SU in the past, has been on insulin for years. His hemoglobin A1c 9.5% at presentation  On his initial presentation , his A1c 9.5%, he was on Glipizide XL and Novolin 70/30 . SU was stopped.     SUBJECTIVE:   During the last visit (10/14/2018): Continued  Novolin MIx dose  Today (02/28/2019): Joseph Liu is here for a 3 month follow up on diabetes management.  He checks his blood sugars 2-3 times daily, preprandial to breakfast and supper. The patient has not had hypoglycemic episodes since the last clinic visit. Otherwise, the patient has not required any recent emergency interventions for hypoglycemia and has not had recent hospitalizations secondary to hyper or hypoglycemic episodes.    ROS: As per HPI and as detailed below: Review of Systems  Respiratory:  Negative for shortness of breath.   Gastrointestinal: Negative for diarrhea and nausea.      HOME DIABETES REGIMEN:  Novolim Mix 18 units QAM and 16 units with dinner    GLUCOSE LOG:       HISTORY:  Past Medical History:  Past Medical History:  Diagnosis Date  . CAD (coronary artery disease)   . CKD (chronic kidney disease)   . DM (diabetes mellitus) ([place])   . GERD (gastroesophageal reflux disease)   . Gout   . HTN (hypertension)   . Hyperlipidemia   . Hypothyroidism    Past Surgical History:  Past Surgical History:  Procedure Laterality Date  . BACK SURGERY    . CARDIAC CATHETERIZATION    . CORONARY ARTERY BYPASS GRAFT N/A 08/18/2018   Procedure: CORONARY ARTERY BYPASS GRAFTING (CABG) times three using left internal mammary artery and right saphenous vein. Aborted attempt left leg vein harvest.;  Surgeon: Lajuana Matte, MD;  Location: [facility];  Service: Open Heart Surgery;  Laterality: N/A;  . TEE WITHOUT CARDIOVERSION N/A 08/18/2018   Procedure: TRANSESOPHAGEAL ECHOCARDIOGRAM (TEE);  Surgeon: Lajuana Matte, MD;  Location: [facility];  Service: Open Heart Surgery;  Laterality: N/A;    Social History:  reports that he has never smoked. He has never used smokeless tobacco. He reports previous alcohol use. He reports that he does not use drugs. Family History:  Family History  Problem Relation Age of Onset  . Aneurysm Mother   . Diabetes Sister   . Pancreatic cancer Brother   .  Diabetes Brother      HOME MEDICATIONS: Allergies as of 02/28/2019      Reactions   Codeine Nausea Only   Sudafed [pseudoephedrine Hcl] Other (See Comments)   Renal problems      Medication List       Accurate as of February 28, 2019  2:54 PM. If you have any questions, ask your nurse or doctor.        acetaminophen 500 MG tablet Commonly known as: TYLENOL Take 1,000 mg by mouth every 6 (six) hours as needed for mild pain.   allopurinol 100 MG tablet Commonly known as:  ZYLOPRIM Take 100 mg by mouth daily.   amLODipine 10 MG tablet Commonly known as: NORVASC Take 1 tablet (10 mg total) by mouth daily.   aspirin 325 MG EC tablet Take 1 tablet (325 mg total) by mouth daily.   atorvastatin 80 MG tablet Commonly known as: LIPITOR Take 1 tablet (80 mg total) by mouth daily at 6 PM.   cholecalciferol 25 MCG (1000 UNIT) tablet Commonly known as: VITAMIN D3 Take 1,000 Units by mouth daily.   folic acid 1 MG tablet Commonly known as: FOLVITE Take 1 mg by mouth daily.   levothyroxine 100 MCG tablet Commonly known as: SYNTHROID Take 100 mcg by mouth daily before breakfast.   methotrexate 2.5 MG tablet Commonly known as: RHEUMATREX Take 2.5 mg by mouth once a week. Caution:Chemotherapy. Protect from light. 5 tablets at the end of the week   metoprolol tartrate 50 MG tablet Commonly known as: LOPRESSOR TAKE 1 TABLET BY MOUTH TWICE A DAY   MULTIVITAMIN ADULTS 50+ PO Take by mouth daily.   NovoLIN 70/30 FlexPen (70-30) 100 UNIT/ML PEN Generic drug: Insulin Isophane & Regular Human Inject 20 Units into the skin daily before breakfast AND 14 Units daily before supper. What changed: See the new instructions. Changed by: Scarlette Shorts, MD   olmesartan 40 MG tablet Commonly known as: BENICAR Take 40 mg by mouth daily.   predniSONE 10 MG tablet Commonly known as: DELTASONE Take 10 mg by mouth daily with breakfast.   traMADol 50 MG tablet Commonly known as: ULTRAM Take 1 tablet (50 mg total) by mouth every 4 (four) hours as needed for moderate pain.        DATA REVIEWED:  Lab Results  Component Value Date   HGBA1C 9.5 (H) 08/17/2018   Lab Results  Component Value Date   LDLCALC 107 (H) 09/09/2018   CREATININE 1.45 (H) 10/12/2018    Lab Results  Component Value Date   CHOL 168 09/09/2018   HDL 40 09/09/2018   LDLCALC 107 (H) 09/09/2018   TRIG 115 09/09/2018   CHOLHDL 4.2 09/09/2018         ASSESSMENT / PLAN /  RECOMMENDATIONS:   1) Type 2 Diabetes Mellitus, with improving glycemic control, With Macrovascular complications:  - His glycemic control continues to improve.  - In review of his glucose log, his fasting BG's has been tight with the lowest at 71 mg/dl, based on that will make the following adjustments.  - I emphasized the importance of taking his insulin mix 20-30 minutes before Breakfast and Supper and to check glucose before breakfast and supper.      MEDICATIONS:   Novolin Mix 20 units with Breakfast and 14 units with Supper  EDUCATION / INSTRUCTIONS:  BG monitoring instructions: Patient is instructed to check his blood sugars 2 times a day, before breakfast and supper.  Call [facility]  Endocrinology clinic if: BG persistently < 70 or > 300. . I reviewed the Rule of 15 for the treatment of hypoglycemia in detail with the patient. Literature supplied   discussed the assessment and treatment plan with the patient. The patient was provided an opportunity to ask questions and all were answered. The patient agreed with the plan and demonstrated an understanding of the instructions.    F/U in 4 months    Signed electronically by: Lyndle Herrlich, MD  [facility]  [facility] [address] [place], [place] [address] Phone: [phone] FAX: [phone]   CC: Joaquin Courts, DO [address] [place] [address] Phone: [phone]  Fax: [phone]  Return to Endocrinology clinic as below: No future appointments.

## 2019-03-02 ENCOUNTER — Telehealth: Payer: Self-pay | Admitting: Internal Medicine

## 2019-03-02 NOTE — Telephone Encounter (Signed)
Attempted to call this office back to see what she means. I have not faxed any paperwork to them for this pt this month. Also, there is not a ROI signed. So just needed some clarification. Lft vm to return call.

## 2019-03-02 NOTE — Telephone Encounter (Signed)
Joann from [facility] called saying they only got a few pages from the fax that was sent to them. She asks that we try a new fax# [phone] (chart notes for 02/28/19)

## 2019-05-30 ENCOUNTER — Ambulatory Visit: Payer: Medicare PPO | Admitting: Internal Medicine

## 2019-11-06 IMAGING — CR DG CHEST 2V
2 series · 2 of 2 positions shown · non-contrast
Comparison: 08/21/2018

CLINICAL DATA: Status post CABG

EXAM:
CHEST - 2 VIEW

[w chest pa]
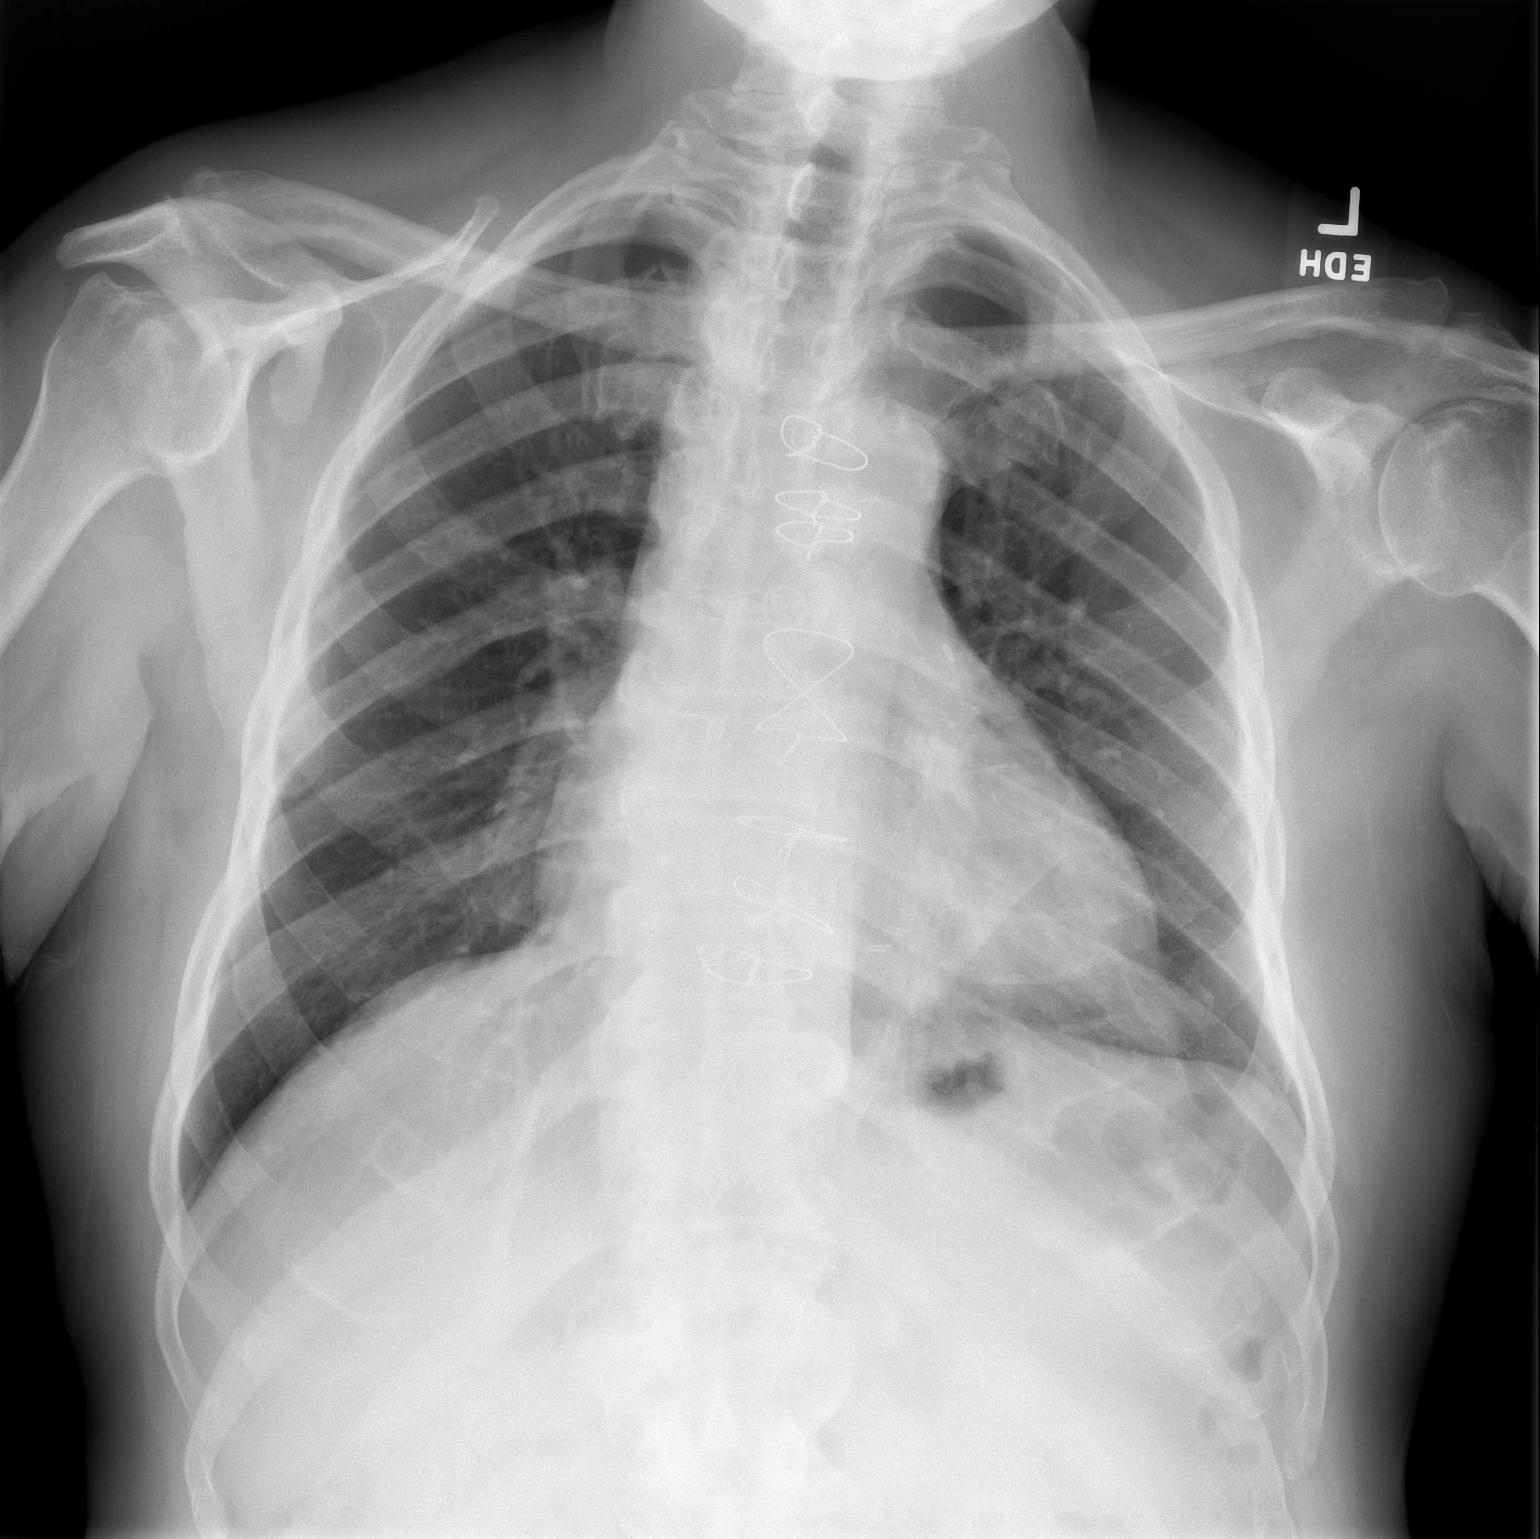

[w chest lat]
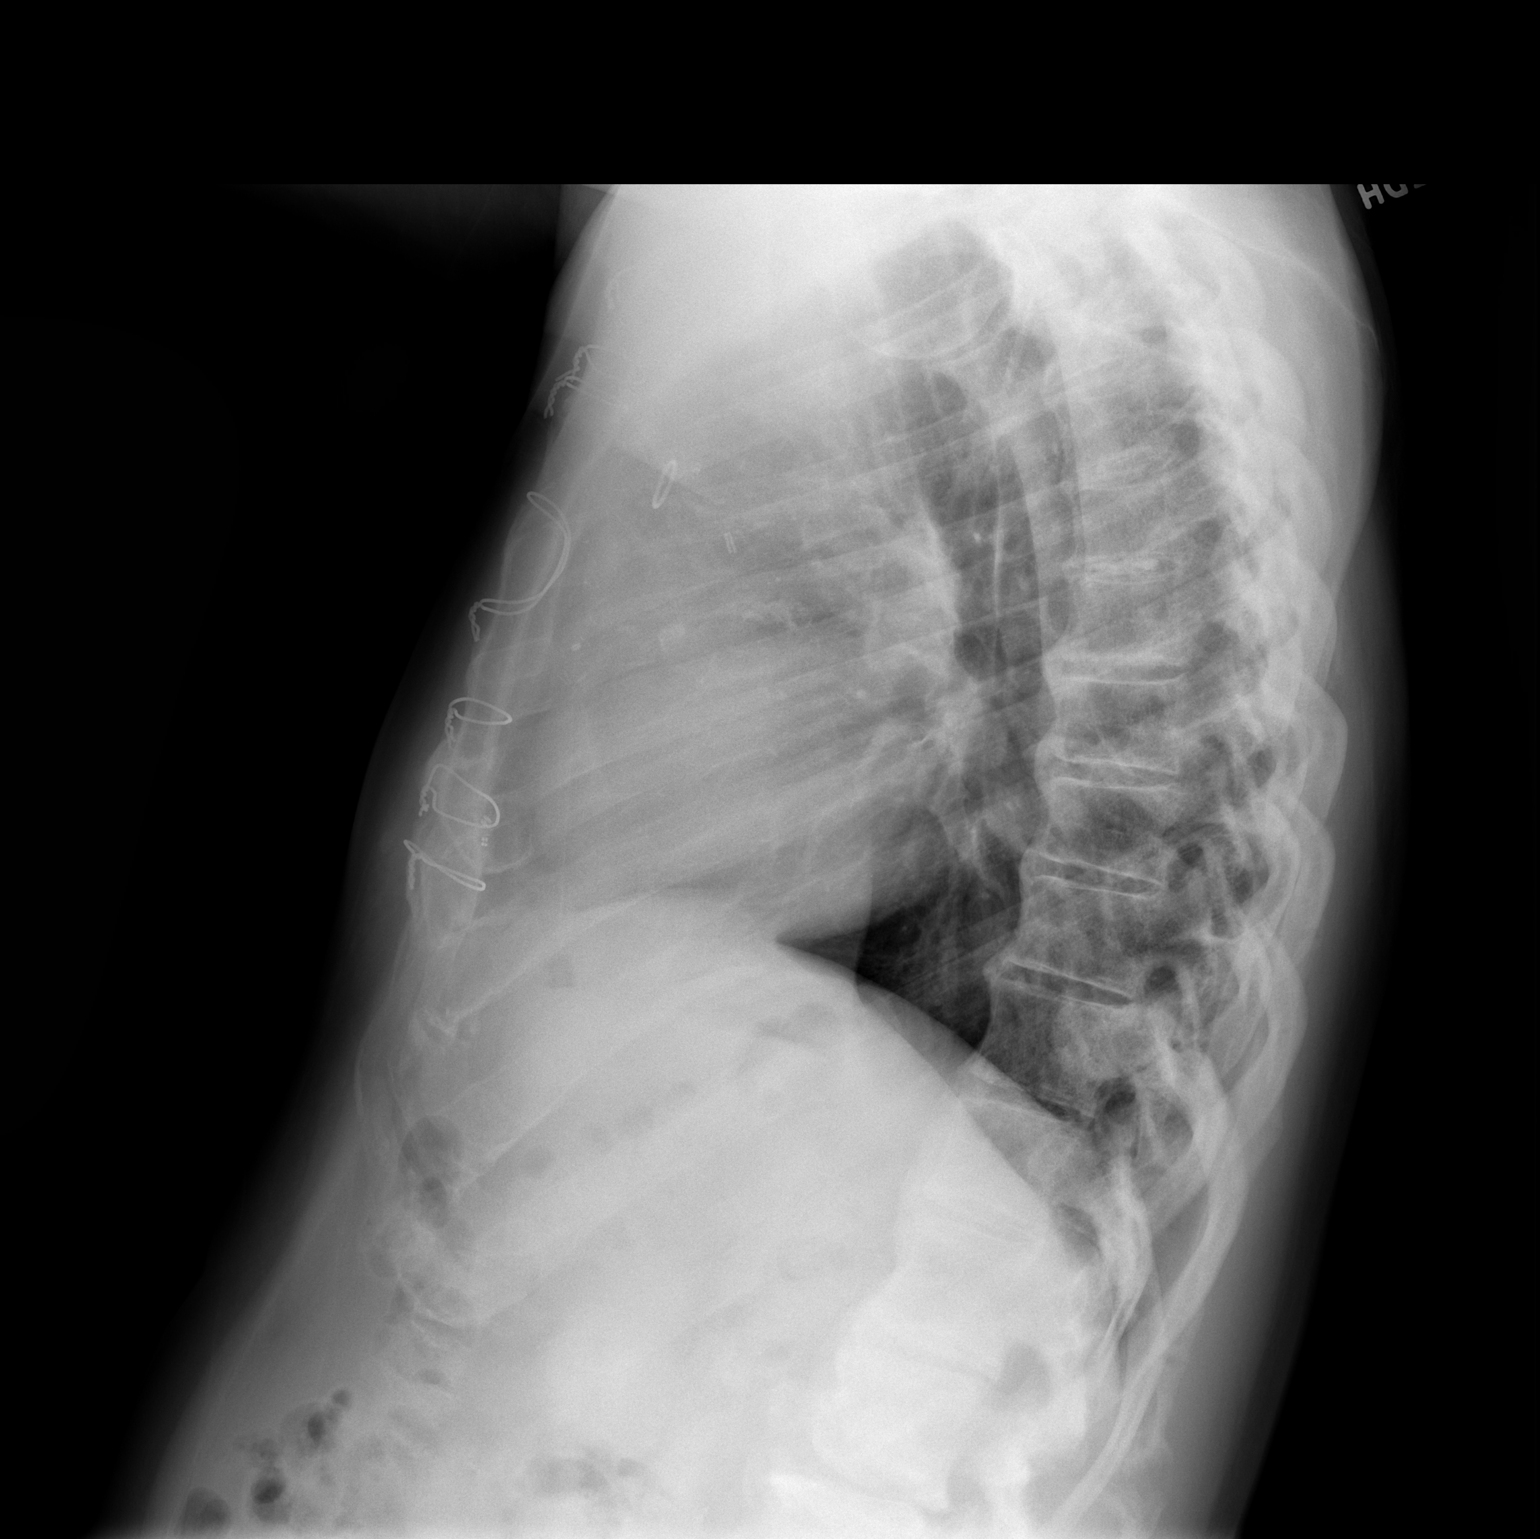

[2 of 2 positions shown; findings below may reference images not displayed]

FINDINGS: Cardiomegaly status post median sternotomy and CABG. Both lungs are
clear. Disc degenerative disease and ankylosis of the thoracic
spine.
IMPRESSION: Cardiomegaly status post median sternotomy and CABG, without acute
abnormality of the lungs.

## 2020-09-10 ENCOUNTER — Other Ambulatory Visit: Payer: Self-pay | Admitting: Family Medicine

## 2020-09-10 DIAGNOSIS — I10 Essential (primary) hypertension: Secondary | ICD-10-CM
